# Patient Record
Sex: Female | Born: 1945
Health system: Southern US, Community
[De-identification: ages and names within clinical notes are randomized; demographics above are authoritative.]

## PROBLEM LIST (undated history)

## (undated) DIAGNOSIS — J302 Other seasonal allergic rhinitis: Secondary | ICD-10-CM

## (undated) DIAGNOSIS — E7800 Pure hypercholesterolemia, unspecified: Secondary | ICD-10-CM

## (undated) DIAGNOSIS — I10 Essential (primary) hypertension: Secondary | ICD-10-CM

## (undated) DIAGNOSIS — M1990 Unspecified osteoarthritis, unspecified site: Secondary | ICD-10-CM

## (undated) HISTORY — PX: COLONOSCOPY: SHX174

## (undated) HISTORY — PX: EYE SURGERY: SHX253

---

## 2001-03-07 ENCOUNTER — Encounter: Payer: Self-pay | Admitting: Internal Medicine

## 2001-03-07 ENCOUNTER — Ambulatory Visit (HOSPITAL_COMMUNITY): Admission: RE | Admit: 2001-03-07 | Discharge: 2001-03-07 | Payer: Self-pay | Admitting: Internal Medicine

## 2002-03-09 ENCOUNTER — Ambulatory Visit (HOSPITAL_COMMUNITY): Admission: RE | Admit: 2002-03-09 | Discharge: 2002-03-09 | Payer: Self-pay | Admitting: Internal Medicine

## 2002-03-09 ENCOUNTER — Encounter: Payer: Self-pay | Admitting: Internal Medicine

## 2002-04-10 ENCOUNTER — Ambulatory Visit (HOSPITAL_COMMUNITY): Admission: RE | Admit: 2002-04-10 | Discharge: 2002-04-10 | Payer: Self-pay | Admitting: Internal Medicine

## 2003-03-17 ENCOUNTER — Ambulatory Visit (HOSPITAL_COMMUNITY): Admission: RE | Admit: 2003-03-17 | Discharge: 2003-03-17 | Payer: Self-pay | Admitting: Internal Medicine

## 2004-04-27 ENCOUNTER — Ambulatory Visit (HOSPITAL_COMMUNITY): Admission: RE | Admit: 2004-04-27 | Discharge: 2004-04-27 | Payer: Self-pay | Admitting: Internal Medicine

## 2005-04-30 ENCOUNTER — Ambulatory Visit (HOSPITAL_COMMUNITY): Admission: RE | Admit: 2005-04-30 | Discharge: 2005-04-30 | Payer: Self-pay | Admitting: Internal Medicine

## 2006-05-09 ENCOUNTER — Ambulatory Visit (HOSPITAL_COMMUNITY): Admission: RE | Admit: 2006-05-09 | Discharge: 2006-05-09 | Payer: Self-pay | Admitting: Internal Medicine

## 2007-05-12 ENCOUNTER — Ambulatory Visit (HOSPITAL_COMMUNITY): Admission: RE | Admit: 2007-05-12 | Discharge: 2007-05-12 | Payer: Self-pay | Admitting: Internal Medicine

## 2008-06-02 ENCOUNTER — Ambulatory Visit (HOSPITAL_COMMUNITY): Admission: RE | Admit: 2008-06-02 | Discharge: 2008-06-02 | Payer: Self-pay | Admitting: Internal Medicine

## 2009-06-06 ENCOUNTER — Ambulatory Visit (HOSPITAL_COMMUNITY)
Admission: RE | Admit: 2009-06-06 | Discharge: 2009-06-06 | Payer: Self-pay | Source: Home / Self Care | Admitting: Internal Medicine

## 2010-06-19 ENCOUNTER — Ambulatory Visit (HOSPITAL_COMMUNITY)
Admission: RE | Admit: 2010-06-19 | Discharge: 2010-06-19 | Payer: Self-pay | Source: Home / Self Care | Attending: Internal Medicine | Admitting: Internal Medicine

## 2011-06-08 ENCOUNTER — Other Ambulatory Visit (HOSPITAL_COMMUNITY): Payer: Self-pay | Admitting: Internal Medicine

## 2011-06-08 DIAGNOSIS — Z139 Encounter for screening, unspecified: Secondary | ICD-10-CM

## 2011-06-22 ENCOUNTER — Ambulatory Visit (HOSPITAL_COMMUNITY)
Admission: RE | Admit: 2011-06-22 | Discharge: 2011-06-22 | Disposition: A | Discharge status: Home / Self Care | Payer: Medicare Other | Source: Ambulatory Visit | Attending: Internal Medicine | Admitting: Internal Medicine

## 2011-06-22 DIAGNOSIS — Z1231 Encounter for screening mammogram for malignant neoplasm of breast: Secondary | ICD-10-CM | POA: Insufficient documentation

## 2011-06-22 DIAGNOSIS — Z139 Encounter for screening, unspecified: Secondary | ICD-10-CM

## 2012-06-10 ENCOUNTER — Other Ambulatory Visit (HOSPITAL_COMMUNITY): Payer: Self-pay | Admitting: Internal Medicine

## 2012-06-10 DIAGNOSIS — Z139 Encounter for screening, unspecified: Secondary | ICD-10-CM

## 2012-06-24 ENCOUNTER — Ambulatory Visit (HOSPITAL_COMMUNITY)
Admission: RE | Admit: 2012-06-24 | Discharge: 2012-06-24 | Disposition: A | Discharge status: Home / Self Care | Payer: Medicare Other | Source: Ambulatory Visit | Attending: Internal Medicine | Admitting: Internal Medicine

## 2012-06-24 DIAGNOSIS — Z139 Encounter for screening, unspecified: Secondary | ICD-10-CM

## 2012-06-24 DIAGNOSIS — Z1231 Encounter for screening mammogram for malignant neoplasm of breast: Secondary | ICD-10-CM | POA: Insufficient documentation

## 2012-08-08 ENCOUNTER — Other Ambulatory Visit (INDEPENDENT_AMBULATORY_CARE_PROVIDER_SITE_OTHER): Payer: Self-pay | Admitting: *Deleted

## 2012-08-08 ENCOUNTER — Telehealth (INDEPENDENT_AMBULATORY_CARE_PROVIDER_SITE_OTHER): Payer: Self-pay | Admitting: *Deleted

## 2012-08-08 DIAGNOSIS — Z1211 Encounter for screening for malignant neoplasm of colon: Secondary | ICD-10-CM

## 2012-08-08 MED ORDER — PEG-KCL-NACL-NASULF-NA ASC-C 100 G PO SOLR: 1.0000 | Freq: Once | ORAL | Status: DC

## 2012-08-08 NOTE — Telephone Encounter (Signed)
Patient needs movi prep 

## 2012-08-27 ENCOUNTER — Telehealth (INDEPENDENT_AMBULATORY_CARE_PROVIDER_SITE_OTHER): Payer: Self-pay | Admitting: *Deleted

## 2012-08-27 NOTE — Telephone Encounter (Signed)
  Procedure: tcs  Reason/Indication:  screening  Has patient had this procedure before?  Yes, 2003  If so, when, by whom and where?    Is there a family history of colon cancer?  no  Who?  What age when diagnosed?    Is patient diabetic?   no      Does patient have prosthetic heart valve?  no  Do you have a pacemaker?  no  Has patient ever had endocarditis? no  Has patient had joint replacement within last 12 months?  no  Is patient on Coumadin, Plavix and/or Aspirin? no  Medications: vitamins  Allergies: nkda  Medication Adjustment:   Procedure date & time: 09/25/12 at 830

## 2012-08-28 NOTE — Telephone Encounter (Signed)
agree

## 2012-09-11 ENCOUNTER — Encounter (HOSPITAL_COMMUNITY): Payer: Self-pay | Admitting: Pharmacy Technician

## 2012-09-25 ENCOUNTER — Ambulatory Visit (HOSPITAL_COMMUNITY)
Admission: RE | Admit: 2012-09-25 | Discharge: 2012-09-25 | Disposition: A | Discharge status: Home / Self Care | Payer: Medicare Other | Source: Ambulatory Visit | Attending: Internal Medicine | Admitting: Internal Medicine

## 2012-09-25 ENCOUNTER — Encounter (HOSPITAL_COMMUNITY)
Admission: RE | Disposition: A | Discharge status: Home / Self Care | Payer: Self-pay | Source: Ambulatory Visit | Attending: Internal Medicine

## 2012-09-25 ENCOUNTER — Encounter (HOSPITAL_COMMUNITY): Payer: Self-pay | Admitting: *Deleted

## 2012-09-25 DIAGNOSIS — K644 Residual hemorrhoidal skin tags: Secondary | ICD-10-CM | POA: Insufficient documentation

## 2012-09-25 DIAGNOSIS — D126 Benign neoplasm of colon, unspecified: Secondary | ICD-10-CM

## 2012-09-25 DIAGNOSIS — Z1211 Encounter for screening for malignant neoplasm of colon: Secondary | ICD-10-CM | POA: Insufficient documentation

## 2012-09-25 HISTORY — PX: COLONOSCOPY: SHX5424

## 2012-09-25 HISTORY — DX: Pure hypercholesterolemia, unspecified: E78.00

## 2012-09-25 SURGERY — COLONOSCOPY
Anesthesia: Moderate Sedation

## 2012-09-25 MED ORDER — MIDAZOLAM HCL 5 MG/5ML IJ SOLN
INTRAMUSCULAR | Status: AC
  Filled 2012-09-25: qty 10

## 2012-09-25 MED ORDER — STERILE WATER FOR IRRIGATION IR SOLN
Status: DC | PRN
  Administered 2012-09-25: 08:00:00

## 2012-09-25 MED ORDER — SODIUM CHLORIDE 0.9 % IV SOLN
INTRAVENOUS | Status: DC
  Administered 2012-09-25: 08:00:00 via INTRAVENOUS

## 2012-09-25 MED ORDER — MEPERIDINE HCL 50 MG/ML IJ SOLN
INTRAMUSCULAR | Status: DC | PRN
  Administered 2012-09-25 (×2): 25 mg via INTRAVENOUS

## 2012-09-25 MED ORDER — MEPERIDINE HCL 50 MG/ML IJ SOLN
INTRAMUSCULAR | Status: AC
  Filled 2012-09-25: qty 1

## 2012-09-25 MED ORDER — MIDAZOLAM HCL 5 MG/5ML IJ SOLN
INTRAMUSCULAR | Status: DC | PRN
  Administered 2012-09-25: 1 mg via INTRAVENOUS
  Administered 2012-09-25 (×2): 2 mg via INTRAVENOUS

## 2012-09-25 NOTE — H&P (Signed)
Jessica Dominguez is an 67 y.o. female.   Chief Complaint: Patient is here for colonoscopy. HPI: Patient is 67 year old Caucasian female who is in for screening colonoscopy. Her last exam was 10 years ago. She denies abdominal pain change in bowel habits rectal bleeding. Family history is negative for colorectal carcinoma.  Past Medical History  Diagnosis Date  . Hypercholesteremia     Past Surgical History  Procedure Laterality Date  . Eye surgery    . Colonoscopy      Family History  Problem Relation Age of Onset  . Colon cancer Neg Hx    Social History:  reports that she has never smoked. She does not have any smokeless tobacco history on file. She reports that  drinks alcohol. She reports that she does not use illicit drugs.  Allergies: No Known Allergies  Medications Prior to Admission  Medication Sig Dispense Refill  . peg 3350 powder (MOVIPREP) 100 G SOLR Take 1 kit (100 g total) by mouth once.  1 kit  0  . Prenatal Vit-Fe Fumarate-FA (PRENATAL MULTIVITAMIN) TABS Take 1 tablet by mouth daily at 12 noon.        No results found for this or any previous visit (from the past 48 hour(s)). No results found.  ROS  Blood pressure 112/66, pulse 62, temperature 98 F (36.7 C), temperature source Oral, resp. rate 21, height 5\' 3"  (1.6 m), weight 143 lb (64.864 kg), SpO2 97.00%. Physical Exam  Constitutional: She appears well-developed and well-nourished.  HENT:  Mouth/Throat: Oropharynx is clear and moist.  Eyes: Conjunctivae are normal. No scleral icterus.  Neck: No thyromegaly present.  Cardiovascular: Normal rate, regular rhythm and normal heart sounds.   No murmur heard. Respiratory: Effort normal and breath sounds normal.  GI: Soft. She exhibits no distension and no mass. There is no tenderness.  Musculoskeletal: She exhibits no edema.  Lymphadenopathy:    She has no cervical adenopathy.  Neurological: She is alert.  Skin: Skin is warm and dry.      Assessment/Plan Average risk screening colonoscopy.  REHMAN,NAJEEB U 09/25/2012, 8:26 AM

## 2012-09-25 NOTE — Op Note (Signed)
COLONOSCOPY PROCEDURE REPORT  PATIENT:  Jessica Dominguez  MR#:  [id number] Birthdate:  09/26/1945, 67 y.o., female Endoscopist:  Dr. Malissa Hippo, MD Referred By:  Dr. Carylon Perches, MD  Procedure Date: 09/25/2012  Procedure:   Colonoscopy  Indications:  Patient is 67 year old Caucasian female who is undergoing average risk screening colonoscopy.  Informed Consent:  The procedure and risks were reviewed with the patient and informed consent was obtained.  Medications:  Demerol 50 mg IV Versed 5 mg IV  Description of procedure:  After a digital rectal exam was performed, that colonoscope was advanced from the anus through the rectum and colon to the area of the cecum, ileocecal valve and appendiceal orifice. The cecum was deeply intubated. These structures were well-seen and photographed for the record. From the level of the cecum and ileocecal valve, the scope was slowly and cautiously withdrawn. The mucosal surfaces were carefully surveyed utilizing scope tip to flexion to facilitate fold flattening as needed. The scope was pulled down into the rectum where a thorough exam including retroflexion was performed.  Findings:  Prep excellent. Small polyp a miracle biopsy from the hepatic flexure. Normal rectal mucosa. Small hemorrhoids below the dentate line. Single small anal tag.    Therapeutic/Diagnostic Maneuvers Performed:  See above  Complications:  None  Cecal Withdrawal Time:  13 minutes  Impression:  Examination performed to cecum. Small polyp ablated via cold biopsy from hepatic flexure. External hemorrhoids and single anal tag.  Recommendations:  Standard instructions given. I will contact patient with biopsy results and further recommendations.  REHMAN,NAJEEB U  09/25/2012 9:08 AM  CC: Dr. Carylon Perches, MD & Dr. Bonnetta Barry ref. provider found

## 2012-09-29 ENCOUNTER — Encounter (HOSPITAL_COMMUNITY): Payer: Self-pay | Admitting: Internal Medicine

## 2012-10-06 ENCOUNTER — Encounter (INDEPENDENT_AMBULATORY_CARE_PROVIDER_SITE_OTHER): Payer: Self-pay | Admitting: *Deleted

## 2013-06-11 ENCOUNTER — Other Ambulatory Visit (HOSPITAL_COMMUNITY): Payer: Self-pay | Admitting: Internal Medicine

## 2013-06-11 DIAGNOSIS — Z139 Encounter for screening, unspecified: Secondary | ICD-10-CM

## 2013-06-30 ENCOUNTER — Ambulatory Visit (HOSPITAL_COMMUNITY)
Admission: RE | Admit: 2013-06-30 | Discharge: 2013-06-30 | Disposition: A | Discharge status: Home / Self Care | Payer: Medicare Other | Source: Ambulatory Visit | Attending: Internal Medicine | Admitting: Internal Medicine

## 2013-06-30 DIAGNOSIS — Z139 Encounter for screening, unspecified: Secondary | ICD-10-CM

## 2013-06-30 DIAGNOSIS — Z1231 Encounter for screening mammogram for malignant neoplasm of breast: Secondary | ICD-10-CM | POA: Insufficient documentation

## 2014-06-17 ENCOUNTER — Other Ambulatory Visit (HOSPITAL_COMMUNITY): Payer: Self-pay | Admitting: Internal Medicine

## 2014-06-17 DIAGNOSIS — Z1231 Encounter for screening mammogram for malignant neoplasm of breast: Secondary | ICD-10-CM

## 2014-07-07 ENCOUNTER — Ambulatory Visit (HOSPITAL_COMMUNITY)
Admission: RE | Admit: 2014-07-07 | Discharge: 2014-07-07 | Disposition: A | Discharge status: Home / Self Care | Payer: Medicare Other | Source: Ambulatory Visit | Attending: Internal Medicine | Admitting: Internal Medicine

## 2014-07-07 DIAGNOSIS — Z1231 Encounter for screening mammogram for malignant neoplasm of breast: Secondary | ICD-10-CM

## 2015-07-01 ENCOUNTER — Other Ambulatory Visit (HOSPITAL_COMMUNITY): Payer: Self-pay | Admitting: Internal Medicine

## 2015-07-01 DIAGNOSIS — Z1231 Encounter for screening mammogram for malignant neoplasm of breast: Secondary | ICD-10-CM

## 2015-07-14 ENCOUNTER — Ambulatory Visit (HOSPITAL_COMMUNITY)
Admission: RE | Admit: 2015-07-14 | Discharge: 2015-07-14 | Disposition: A | Discharge status: Home / Self Care | Payer: Medicare HMO | Source: Ambulatory Visit | Attending: Internal Medicine | Admitting: Internal Medicine

## 2015-07-14 DIAGNOSIS — Z1231 Encounter for screening mammogram for malignant neoplasm of breast: Secondary | ICD-10-CM | POA: Insufficient documentation

## 2015-12-07 DIAGNOSIS — E781 Pure hyperglyceridemia: Secondary | ICD-10-CM | POA: Diagnosis not present

## 2015-12-07 DIAGNOSIS — Z79899 Other long term (current) drug therapy: Secondary | ICD-10-CM | POA: Diagnosis not present

## 2015-12-07 DIAGNOSIS — M1990 Unspecified osteoarthritis, unspecified site: Secondary | ICD-10-CM | POA: Diagnosis not present

## 2015-12-07 DIAGNOSIS — E785 Hyperlipidemia, unspecified: Secondary | ICD-10-CM | POA: Diagnosis not present

## 2015-12-15 DIAGNOSIS — M1990 Unspecified osteoarthritis, unspecified site: Secondary | ICD-10-CM | POA: Diagnosis not present

## 2015-12-15 DIAGNOSIS — Z6826 Body mass index (BMI) 26.0-26.9, adult: Secondary | ICD-10-CM | POA: Diagnosis not present

## 2015-12-15 DIAGNOSIS — E785 Hyperlipidemia, unspecified: Secondary | ICD-10-CM | POA: Diagnosis not present

## 2015-12-15 DIAGNOSIS — Z0001 Encounter for general adult medical examination with abnormal findings: Secondary | ICD-10-CM | POA: Diagnosis not present

## 2016-02-28 DIAGNOSIS — Z23 Encounter for immunization: Secondary | ICD-10-CM | POA: Diagnosis not present

## 2016-06-28 ENCOUNTER — Other Ambulatory Visit (HOSPITAL_COMMUNITY): Payer: Self-pay | Admitting: Internal Medicine

## 2016-06-28 DIAGNOSIS — Z1231 Encounter for screening mammogram for malignant neoplasm of breast: Secondary | ICD-10-CM

## 2016-07-19 ENCOUNTER — Ambulatory Visit (HOSPITAL_COMMUNITY)
Admission: RE | Admit: 2016-07-19 | Discharge: 2016-07-19 | Disposition: A | Discharge status: Home / Self Care | Payer: Medicare HMO | Source: Ambulatory Visit | Attending: Internal Medicine | Admitting: Internal Medicine

## 2016-07-19 ENCOUNTER — Other Ambulatory Visit (HOSPITAL_COMMUNITY): Payer: Self-pay | Admitting: Internal Medicine

## 2016-07-19 DIAGNOSIS — Z1231 Encounter for screening mammogram for malignant neoplasm of breast: Secondary | ICD-10-CM

## 2016-08-17 DIAGNOSIS — Z6825 Body mass index (BMI) 25.0-25.9, adult: Secondary | ICD-10-CM | POA: Diagnosis not present

## 2016-08-17 DIAGNOSIS — Z0000 Encounter for general adult medical examination without abnormal findings: Secondary | ICD-10-CM | POA: Diagnosis not present

## 2016-08-17 DIAGNOSIS — R238 Other skin changes: Secondary | ICD-10-CM | POA: Diagnosis not present

## 2016-12-17 DIAGNOSIS — Z79899 Other long term (current) drug therapy: Secondary | ICD-10-CM | POA: Diagnosis not present

## 2016-12-17 DIAGNOSIS — E785 Hyperlipidemia, unspecified: Secondary | ICD-10-CM | POA: Diagnosis not present

## 2016-12-17 DIAGNOSIS — M1990 Unspecified osteoarthritis, unspecified site: Secondary | ICD-10-CM | POA: Diagnosis not present

## 2016-12-25 DIAGNOSIS — Z6826 Body mass index (BMI) 26.0-26.9, adult: Secondary | ICD-10-CM | POA: Diagnosis not present

## 2016-12-25 DIAGNOSIS — M1990 Unspecified osteoarthritis, unspecified site: Secondary | ICD-10-CM | POA: Diagnosis not present

## 2016-12-25 DIAGNOSIS — E785 Hyperlipidemia, unspecified: Secondary | ICD-10-CM | POA: Diagnosis not present

## 2016-12-25 DIAGNOSIS — Z0001 Encounter for general adult medical examination with abnormal findings: Secondary | ICD-10-CM | POA: Diagnosis not present

## 2017-02-26 DIAGNOSIS — Z23 Encounter for immunization: Secondary | ICD-10-CM | POA: Diagnosis not present

## 2017-07-04 ENCOUNTER — Other Ambulatory Visit (HOSPITAL_COMMUNITY): Payer: Self-pay | Admitting: Internal Medicine

## 2017-07-04 DIAGNOSIS — Z1231 Encounter for screening mammogram for malignant neoplasm of breast: Secondary | ICD-10-CM

## 2017-07-25 ENCOUNTER — Ambulatory Visit (HOSPITAL_COMMUNITY)
Admission: RE | Admit: 2017-07-25 | Discharge: 2017-07-25 | Disposition: A | Discharge status: Home / Self Care | Payer: Medicare HMO | Source: Ambulatory Visit | Attending: Internal Medicine | Admitting: Internal Medicine

## 2017-07-25 DIAGNOSIS — Z1231 Encounter for screening mammogram for malignant neoplasm of breast: Secondary | ICD-10-CM | POA: Diagnosis not present

## 2018-01-16 DIAGNOSIS — Z79899 Other long term (current) drug therapy: Secondary | ICD-10-CM | POA: Diagnosis not present

## 2018-01-16 DIAGNOSIS — M1990 Unspecified osteoarthritis, unspecified site: Secondary | ICD-10-CM | POA: Diagnosis not present

## 2018-01-16 DIAGNOSIS — E785 Hyperlipidemia, unspecified: Secondary | ICD-10-CM | POA: Diagnosis not present

## 2018-01-23 DIAGNOSIS — N189 Chronic kidney disease, unspecified: Secondary | ICD-10-CM | POA: Diagnosis not present

## 2018-01-23 DIAGNOSIS — E785 Hyperlipidemia, unspecified: Secondary | ICD-10-CM | POA: Diagnosis not present

## 2018-01-23 DIAGNOSIS — Z6827 Body mass index (BMI) 27.0-27.9, adult: Secondary | ICD-10-CM | POA: Diagnosis not present

## 2018-01-23 DIAGNOSIS — Z0001 Encounter for general adult medical examination with abnormal findings: Secondary | ICD-10-CM | POA: Diagnosis not present

## 2018-01-23 DIAGNOSIS — M1990 Unspecified osteoarthritis, unspecified site: Secondary | ICD-10-CM | POA: Diagnosis not present

## 2018-02-06 DIAGNOSIS — N189 Chronic kidney disease, unspecified: Secondary | ICD-10-CM | POA: Diagnosis not present

## 2018-02-06 DIAGNOSIS — Z79899 Other long term (current) drug therapy: Secondary | ICD-10-CM | POA: Diagnosis not present

## 2018-02-06 DIAGNOSIS — E785 Hyperlipidemia, unspecified: Secondary | ICD-10-CM | POA: Diagnosis not present

## 2018-02-13 DIAGNOSIS — Z1283 Encounter for screening for malignant neoplasm of skin: Secondary | ICD-10-CM | POA: Diagnosis not present

## 2018-02-13 DIAGNOSIS — L918 Other hypertrophic disorders of the skin: Secondary | ICD-10-CM | POA: Diagnosis not present

## 2018-02-13 DIAGNOSIS — D225 Melanocytic nevi of trunk: Secondary | ICD-10-CM | POA: Diagnosis not present

## 2018-02-13 DIAGNOSIS — L820 Inflamed seborrheic keratosis: Secondary | ICD-10-CM | POA: Diagnosis not present

## 2018-02-27 DIAGNOSIS — Z23 Encounter for immunization: Secondary | ICD-10-CM | POA: Diagnosis not present

## 2018-07-01 ENCOUNTER — Other Ambulatory Visit (HOSPITAL_COMMUNITY): Payer: Self-pay | Admitting: Internal Medicine

## 2018-07-01 DIAGNOSIS — Z1231 Encounter for screening mammogram for malignant neoplasm of breast: Secondary | ICD-10-CM

## 2018-07-30 ENCOUNTER — Ambulatory Visit (HOSPITAL_COMMUNITY): Payer: Medicare HMO

## 2018-08-06 ENCOUNTER — Other Ambulatory Visit: Payer: Self-pay

## 2018-08-06 ENCOUNTER — Ambulatory Visit (HOSPITAL_COMMUNITY)
Admission: RE | Admit: 2018-08-06 | Discharge: 2018-08-06 | Disposition: A | Discharge status: Home / Self Care | Payer: Medicare HMO | Source: Ambulatory Visit | Attending: Internal Medicine | Admitting: Internal Medicine

## 2018-08-06 DIAGNOSIS — Z1231 Encounter for screening mammogram for malignant neoplasm of breast: Secondary | ICD-10-CM | POA: Diagnosis not present

## 2019-02-03 DIAGNOSIS — Z79899 Other long term (current) drug therapy: Secondary | ICD-10-CM | POA: Diagnosis not present

## 2019-02-03 DIAGNOSIS — N181 Chronic kidney disease, stage 1: Secondary | ICD-10-CM | POA: Diagnosis not present

## 2019-02-03 DIAGNOSIS — E785 Hyperlipidemia, unspecified: Secondary | ICD-10-CM | POA: Diagnosis not present

## 2019-02-03 DIAGNOSIS — M1990 Unspecified osteoarthritis, unspecified site: Secondary | ICD-10-CM | POA: Diagnosis not present

## 2019-02-05 DIAGNOSIS — Z23 Encounter for immunization: Secondary | ICD-10-CM | POA: Diagnosis not present

## 2019-02-10 DIAGNOSIS — M1990 Unspecified osteoarthritis, unspecified site: Secondary | ICD-10-CM | POA: Diagnosis not present

## 2019-02-10 DIAGNOSIS — R3121 Asymptomatic microscopic hematuria: Secondary | ICD-10-CM | POA: Diagnosis not present

## 2019-02-10 DIAGNOSIS — E785 Hyperlipidemia, unspecified: Secondary | ICD-10-CM | POA: Diagnosis not present

## 2019-02-10 DIAGNOSIS — Z0001 Encounter for general adult medical examination with abnormal findings: Secondary | ICD-10-CM | POA: Diagnosis not present

## 2019-06-10 ENCOUNTER — Ambulatory Visit: Payer: Medicare Other | Attending: Internal Medicine

## 2019-06-10 DIAGNOSIS — Z23 Encounter for immunization: Secondary | ICD-10-CM | POA: Insufficient documentation

## 2019-06-10 NOTE — Progress Notes (Signed)
   Covid-19 Vaccination Clinic  Name:  Jessica Dominguez    MRN: [id number] DOB: 02-Feb-1946  06/10/2019  [name] was observed post Covid-19 immunization for 15 minutes without incidence. She was provided with Vaccine Information Sheet and instruction to access the V-Safe system.   [name] was instructed to call 911 with any severe reactions post vaccine: Marland Kitchen Difficulty breathing  . Swelling of your face and throat  . A fast heartbeat  . A bad rash all over your body  . Dizziness and weakness    Immunizations Administered    Name Date Dose VIS Date Route   Pfizer COVID-19 Vaccine 06/10/2019 10:36 AM 0.3 mL 05/01/2019 Intramuscular   Manufacturer: [place]   Lot: [id number]   [place]: [id number]

## 2019-06-29 ENCOUNTER — Ambulatory Visit: Payer: Medicare HMO | Attending: Internal Medicine

## 2019-06-29 DIAGNOSIS — Z23 Encounter for immunization: Secondary | ICD-10-CM

## 2019-06-29 NOTE — Progress Notes (Signed)
   Covid-19 Vaccination Clinic  Name:  Jessica Dominguez    MRN: [id number] DOB: Jul 01, 1945  06/29/2019  Jessica Dominguez was observed post Covid-19 immunization for 15 minutes without incidence. She was provided with Vaccine Information Sheet and instruction to access the V-Safe system.   Jessica Dominguez was instructed to call 911 with any severe reactions post vaccine: Marland Kitchen Difficulty breathing  . Swelling of your face and throat  . A fast heartbeat  . A bad rash all over your body  . Dizziness and weakness    Immunizations Administered    Name Date Dose VIS Date Route   Pfizer COVID-19 Vaccine 06/29/2019  3:41 PM 0.3 mL 05/01/2019 Intramuscular   Manufacturer: [place]   Lot: [id number]   [place]: [id number]

## 2019-07-16 ENCOUNTER — Other Ambulatory Visit (HOSPITAL_COMMUNITY): Payer: Self-pay | Admitting: Internal Medicine

## 2019-07-16 DIAGNOSIS — Z1231 Encounter for screening mammogram for malignant neoplasm of breast: Secondary | ICD-10-CM

## 2019-08-10 ENCOUNTER — Other Ambulatory Visit: Payer: Self-pay

## 2019-08-10 ENCOUNTER — Ambulatory Visit (HOSPITAL_COMMUNITY)
Admission: RE | Admit: 2019-08-10 | Discharge: 2019-08-10 | Disposition: A | Discharge status: Home / Self Care | Payer: Medicare HMO | Source: Ambulatory Visit | Attending: Internal Medicine | Admitting: Internal Medicine

## 2019-08-10 DIAGNOSIS — Z1231 Encounter for screening mammogram for malignant neoplasm of breast: Secondary | ICD-10-CM

## 2020-02-25 ENCOUNTER — Ambulatory Visit: Payer: Medicare HMO | Attending: Internal Medicine

## 2020-02-25 ENCOUNTER — Ambulatory Visit: Payer: Medicare HMO

## 2020-02-25 DIAGNOSIS — Z23 Encounter for immunization: Secondary | ICD-10-CM

## 2020-02-25 NOTE — Progress Notes (Signed)
   Covid-19 Vaccination Clinic  Name:  ELSEY HOLTS    MRN: [id number] DOB: 1945/09/29  02/25/2020  Ms. Lozada was observed post Covid-19 immunization for 15 minutes without incident. She was provided with Vaccine Information Sheet and instruction to access the V-Safe system.   Ms. Berkemeier was instructed to call 911 with any severe reactions post vaccine: Marland Kitchen Difficulty breathing  . Swelling of face and throat  . A fast heartbeat  . A bad rash all over body  . Dizziness and weakness

## 2020-03-03 ENCOUNTER — Ambulatory Visit: Payer: Medicare HMO

## 2020-04-28 IMAGING — MG DIGITAL SCREENING BILAT W/ TOMO W/ CAD
6 of 10 series · 6 of 30 positions shown · non-contrast
Comparison: Previous exam(s).

CLINICAL DATA: Screening.

EXAM:
DIGITAL SCREENING BILATERAL MAMMOGRAM WITH TOMO AND CAD

[R MLO synth-2D (1 of 2)]
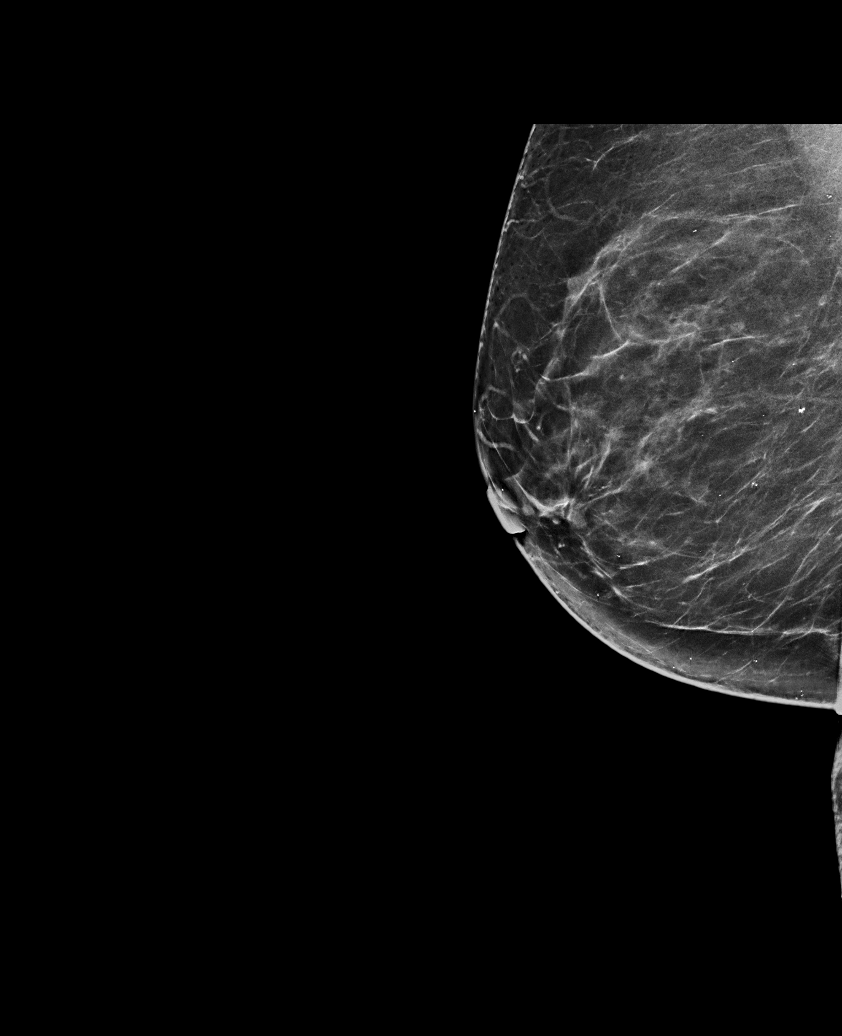

[R MLO synth-2D (2 of 2)]
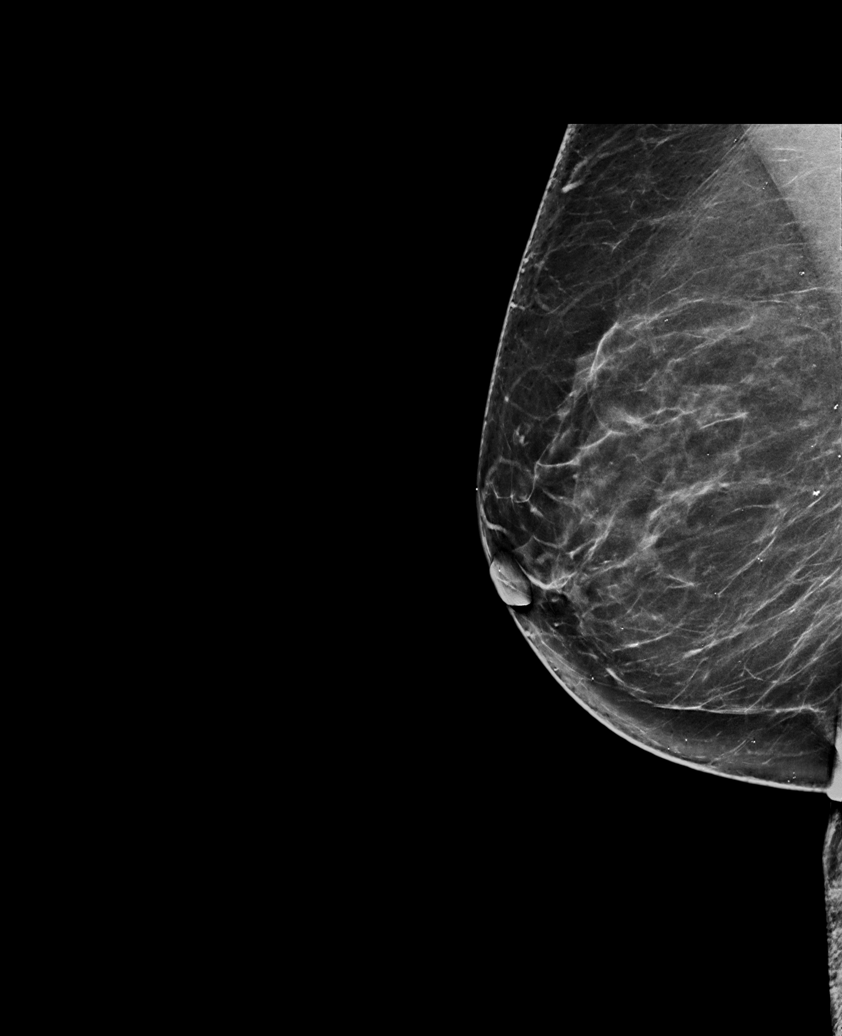

[R CC synth-2D]
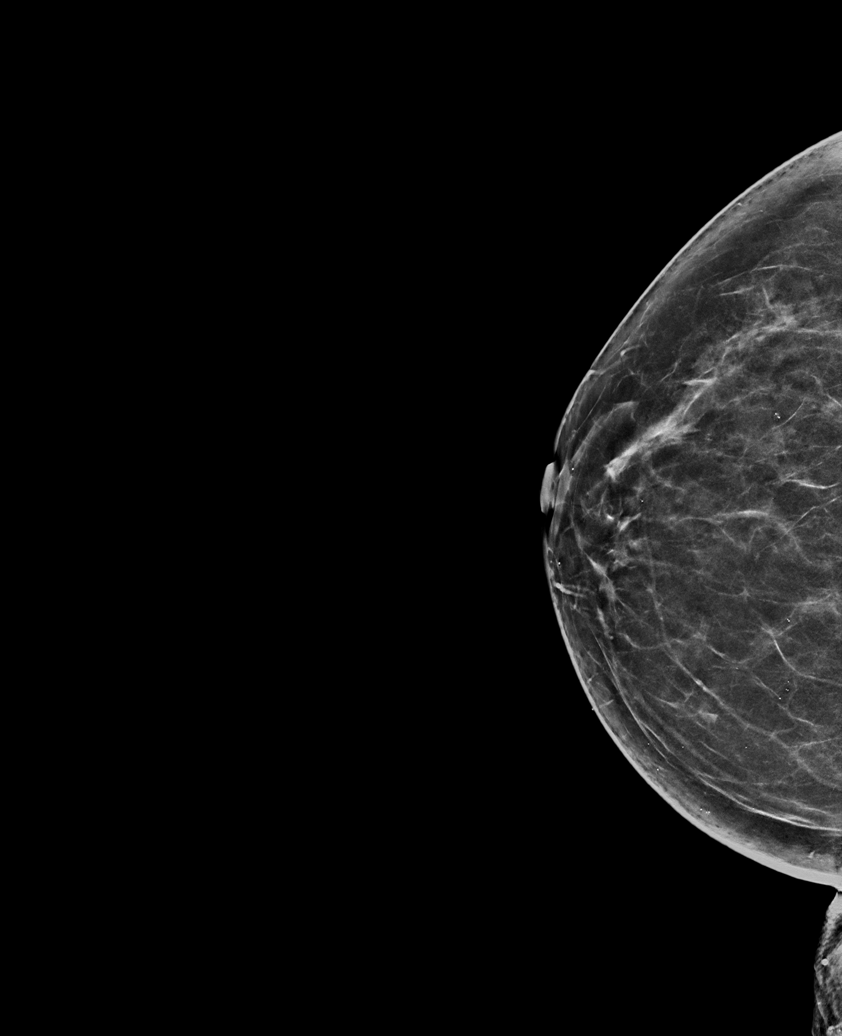

[L CC synth-2D]
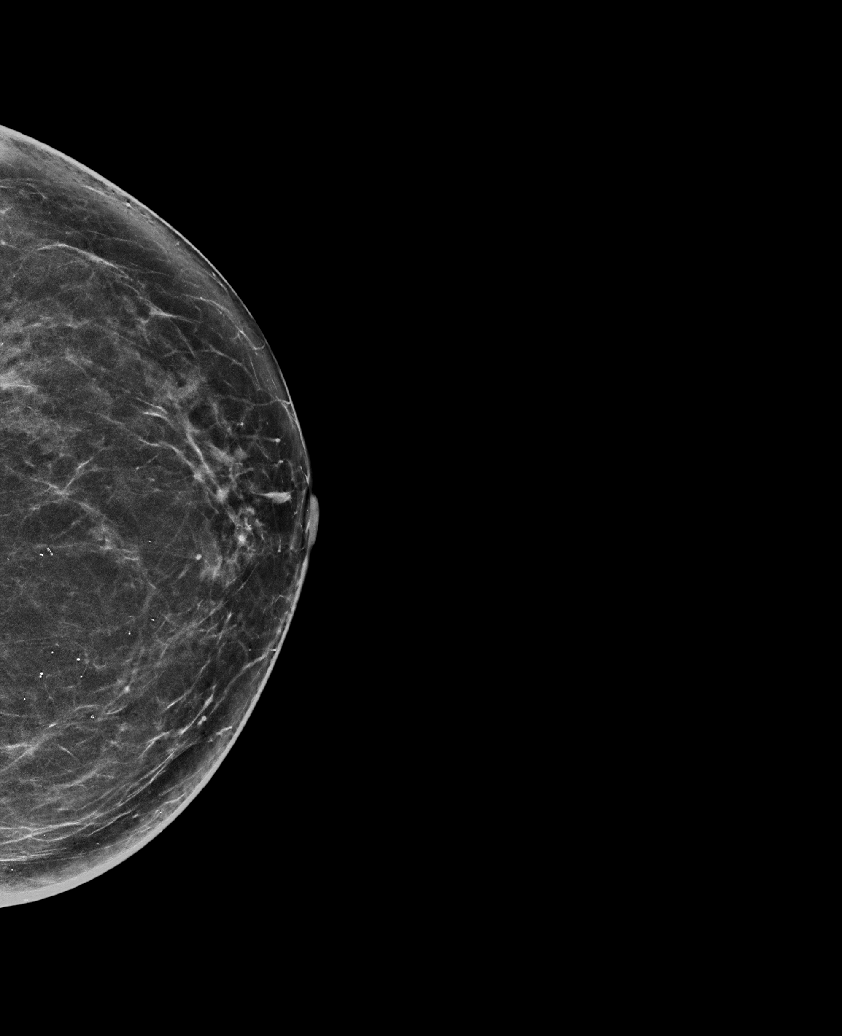

[L MLO synth-2D]
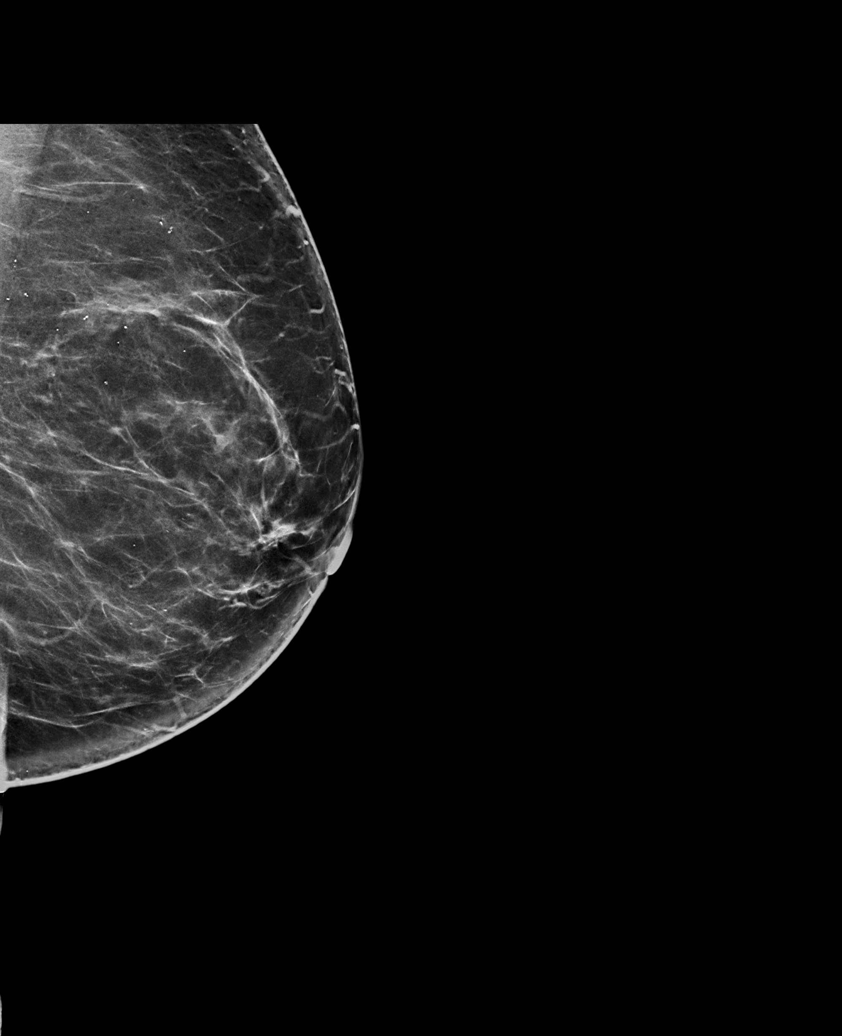

[R MLO tomo · tomo slice 40/79.0]
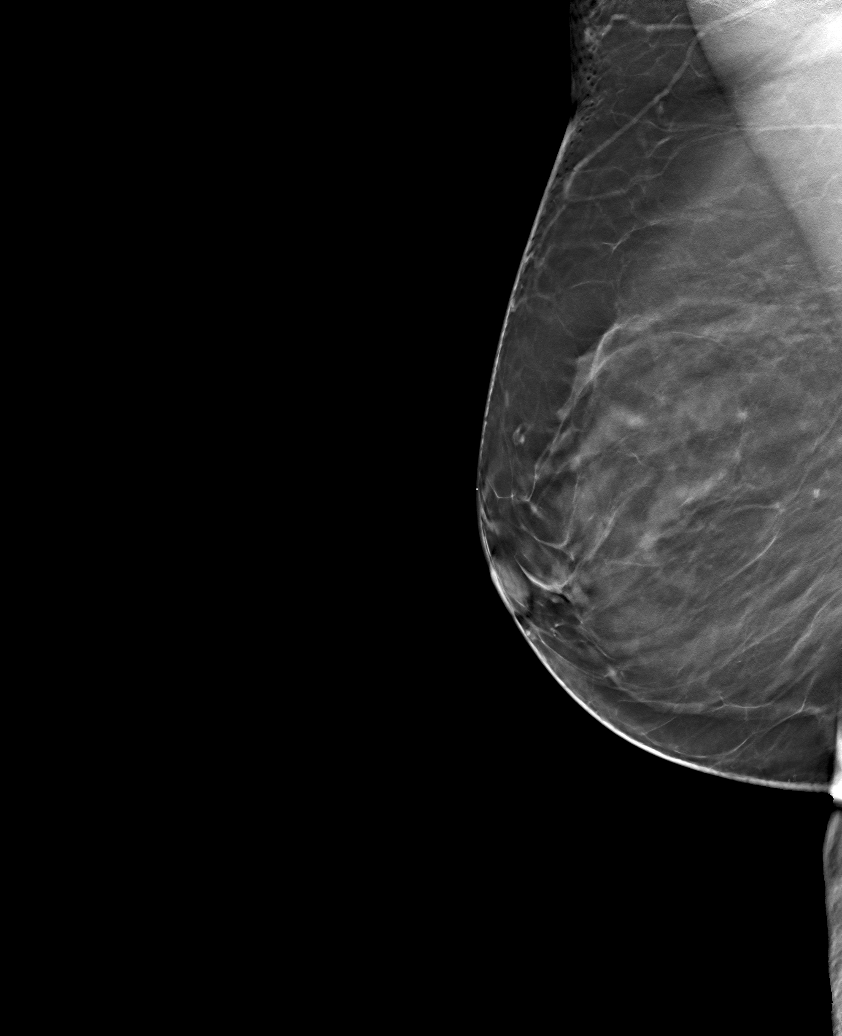

[6 of 30 positions shown; findings below may reference images not displayed]

ACR Breast Density Category b: There are scattered areas of
fibroglandular density.
FINDINGS: There are no findings suspicious for malignancy. Images were
processed with CAD.
IMPRESSION: No mammographic evidence of malignancy. A result letter of this
screening mammogram will be mailed directly to the patient.

RECOMMENDATION:
Screening mammogram in one year. (Code:CN-U-775)

BI-RADS CATEGORY  1: Negative.

## 2020-05-03 ENCOUNTER — Other Ambulatory Visit: Payer: Self-pay

## 2020-05-03 ENCOUNTER — Other Ambulatory Visit: Payer: Medicare HMO

## 2020-05-03 DIAGNOSIS — Z20822 Contact with and (suspected) exposure to covid-19: Secondary | ICD-10-CM

## 2020-05-04 LAB — SARS-COV-2, NAA 2 DAY TAT

## 2020-05-04 LAB — NOVEL CORONAVIRUS, NAA: SARS-CoV-2, NAA: NOT DETECTED

## 2020-07-20 ENCOUNTER — Other Ambulatory Visit (HOSPITAL_COMMUNITY): Payer: Self-pay | Admitting: Internal Medicine

## 2020-07-20 DIAGNOSIS — Z1231 Encounter for screening mammogram for malignant neoplasm of breast: Secondary | ICD-10-CM

## 2020-08-11 ENCOUNTER — Ambulatory Visit (HOSPITAL_COMMUNITY)
Admission: RE | Admit: 2020-08-11 | Discharge: 2020-08-11 | Disposition: A | Discharge status: Home / Self Care | Payer: Medicare HMO | Source: Ambulatory Visit | Attending: Internal Medicine | Admitting: Internal Medicine

## 2020-08-11 DIAGNOSIS — Z1231 Encounter for screening mammogram for malignant neoplasm of breast: Secondary | ICD-10-CM

## 2021-06-19 ENCOUNTER — Encounter (INDEPENDENT_AMBULATORY_CARE_PROVIDER_SITE_OTHER): Payer: Self-pay | Admitting: *Deleted

## 2021-07-04 ENCOUNTER — Other Ambulatory Visit (HOSPITAL_COMMUNITY): Payer: Self-pay | Admitting: Internal Medicine

## 2021-07-04 DIAGNOSIS — Z1231 Encounter for screening mammogram for malignant neoplasm of breast: Secondary | ICD-10-CM

## 2021-08-14 ENCOUNTER — Ambulatory Visit (HOSPITAL_COMMUNITY)
Admission: RE | Admit: 2021-08-14 | Discharge: 2021-08-14 | Disposition: A | Discharge status: Home / Self Care | Payer: Medicare HMO | Source: Ambulatory Visit | Attending: Internal Medicine | Admitting: Internal Medicine

## 2021-08-14 ENCOUNTER — Other Ambulatory Visit: Payer: Self-pay

## 2021-08-14 DIAGNOSIS — Z1231 Encounter for screening mammogram for malignant neoplasm of breast: Secondary | ICD-10-CM | POA: Diagnosis not present

## 2021-08-16 ENCOUNTER — Other Ambulatory Visit (HOSPITAL_COMMUNITY): Payer: Self-pay | Admitting: Internal Medicine

## 2021-08-16 DIAGNOSIS — R928 Other abnormal and inconclusive findings on diagnostic imaging of breast: Secondary | ICD-10-CM

## 2021-08-22 ENCOUNTER — Ambulatory Visit (INDEPENDENT_AMBULATORY_CARE_PROVIDER_SITE_OTHER): Payer: Medicare HMO | Admitting: Gastroenterology

## 2021-08-22 ENCOUNTER — Other Ambulatory Visit (INDEPENDENT_AMBULATORY_CARE_PROVIDER_SITE_OTHER): Payer: Self-pay

## 2021-08-22 ENCOUNTER — Telehealth (INDEPENDENT_AMBULATORY_CARE_PROVIDER_SITE_OTHER): Payer: Self-pay

## 2021-08-22 ENCOUNTER — Encounter (INDEPENDENT_AMBULATORY_CARE_PROVIDER_SITE_OTHER): Payer: Self-pay | Admitting: Gastroenterology

## 2021-08-22 ENCOUNTER — Encounter (INDEPENDENT_AMBULATORY_CARE_PROVIDER_SITE_OTHER): Payer: Self-pay

## 2021-08-22 VITALS — BP 132/77 | HR 73 | Temp 98.1°F | Ht 62.0 in | Wt 150.1 lb

## 2021-08-22 DIAGNOSIS — Z86010 Personal history of colonic polyps: Secondary | ICD-10-CM

## 2021-08-22 DIAGNOSIS — R197 Diarrhea, unspecified: Secondary | ICD-10-CM | POA: Diagnosis not present

## 2021-08-22 DIAGNOSIS — D126 Benign neoplasm of colon, unspecified: Secondary | ICD-10-CM

## 2021-08-22 DIAGNOSIS — Z1211 Encounter for screening for malignant neoplasm of colon: Secondary | ICD-10-CM | POA: Diagnosis not present

## 2021-08-22 MED ORDER — DICYCLOMINE HCL 10 MG PO CAPS: 10.0000 mg | ORAL_CAPSULE | Freq: Three times a day (TID) | ORAL | 1 refills | Status: AC | PRN

## 2021-08-22 MED ORDER — PEG 3350-KCL-NA BICARB-NACL 420 G PO SOLR: 4000.0000 mL | ORAL | 0 refills | Status: DC

## 2021-08-22 NOTE — Telephone Encounter (Signed)
Jery Hollern Ann Salvator Seppala, CMA  ?

## 2021-08-22 NOTE — H&P (View-Only) (Signed)
? ?Referring Provider: Asencion Noble, MD ?Primary Care Physician:  Asencion Noble, MD ?Primary GI Physician: new ? ?Chief Complaint  ?Patient presents with  ? Diarrhea  ?  Patient here today with complaints of diarrhea,especially after meals she will have cramps and then the diarrhea. Takes otc anti diarrheal prn.  ? ?HPI:   ?Jessica Dominguez is a 76 y.o. female with past medical history of high cholesterol. ? ?Patient presenting today for colonoscopy and diarrhea. ? ?She states that for the past few years she has had some lower abdominal discomfort and fecal urgency after eating. She reports 2-3 BMs per day. Stools are usually  looser, not watery. she occasionally has episodes where she does not make it to the restroom in time. She does note that caffeine seemed to make her symptoms worse. She has cut out caffienated coffee and tea with some improvement. She takes an otc diarrhea pill which will help some. She denies any rectal bleeding or melena. She denies any change in appetite or weight, no nausea or vomiting. Very infrequent issues with heartburn or acid reflux.  ? ?Recent labs done in January 2023 with PCP with normal electrolytes and blood counts.  ? ?NSAID use:no NSAIDs  ?Social hx: a couple glasses of wine a few times per week  ?Fam PY:PPJKDT had colon cancer but was metastasized from lung ? ?Last Colonoscopy: 2014, tubular adenoma ?Last Endoscopy:never ? ? ?Past Medical History:  ?Diagnosis Date  ? Hypercholesteremia   ? ? ?Past Surgical History:  ?Procedure Laterality Date  ? COLONOSCOPY    ? COLONOSCOPY N/A 09/25/2012  ? Procedure: COLONOSCOPY;  Surgeon: Rogene Houston, MD;  [facility];  Service: Endoscopy;  Laterality: N/A;  830  ? EYE SURGERY    ? ? ?No current outpatient medications on file.  ? ?No current facility-administered medications for this visit.  ? ? ?Allergies as of 08/22/2021  ? (No Known Allergies)  ? ? ?Family History  ?Problem Relation Age of Onset  ? Colon cancer Neg Hx    ? ? ?Social History  ? ?Socioeconomic History  ? Marital status: Married  ?  Spouse name: Not on file  ? Number of children: Not on file  ? Years of education: Not on file  ? Highest education level: Not on file  ?Occupational History  ? Not on file  ?Tobacco Use  ? Smoking status: Never  ? Smokeless tobacco: Not on file  ?Vaping Use  ? Vaping Use: Never used  ?Substance and Sexual Activity  ? Alcohol use: Yes  ?  Comment: occasionally  ? Drug use: No  ? Sexual activity: Not on file  ?Other Topics Concern  ? Not on file  ?Social History Narrative  ? Not on file  ? ?Social Determinants of Health  ? ?Financial Resource Strain: Not on file  ?Food Insecurity: Not on file  ?Transportation Needs: Not on file  ?Physical Activity: Not on file  ?Stress: Not on file  ?Social Connections: Not on file  ? ?Review of systems ?General: negative for malaise, night sweats, fever, chills, weight loss ?Neck: Negative for lumps, goiter, pain and significant neck swelling ?Resp: Negative for cough, wheezing, dyspnea at rest ?CV: Negative for chest pain, leg swelling, palpitations, orthopnea ?GI: denies melena, hematochezia, nausea, vomiting, constipation, dysphagia, odyonophagia, early satiety or unintentional weight loss. +diarrhea +lower abdominal pain ?MSK: Negative for joint pain or swelling, back pain, and muscle pain. ?Derm: Negative for itching or rash ?Psych: Denies depression, anxiety,  memory loss, confusion. No homicidal or suicidal ideation.  ?Heme: Negative for prolonged bleeding, bruising easily, and swollen nodes. ?Endocrine: Negative for cold or heat intolerance, polyuria, polydipsia and goiter. ?Neuro: negative for tremor, gait imbalance, syncope and seizures. ?The remainder of the review of systems is noncontributory. ? ?Physical Exam: ?BP 132/77 (BP Location: Left Arm, Patient Position: Sitting, Cuff Size: Large)   Pulse 73   Temp 98.1 ?F (36.7 ?C) (Oral)   Ht '5\' 2"'$  (1.575 m)   Wt 150 lb 1.6 oz (68.1 kg)   BMI  27.45 kg/m?  ?General:   Alert and oriented. No distress noted. Pleasant and cooperative.  ?Head:  Normocephalic and atraumatic. ?Eyes:  Conjuctiva clear without scleral icterus. ?Mouth:  Oral mucosa pink and moist. Good dentition. No lesions. ?Heart: Normal rate and rhythm, s1 and s2 heart sounds present.  ?Lungs: Clear lung sounds in all lobes. Respirations equal and unlabored. ?Abdomen:  +BS, soft, non-tender and non-distended. No rebound or guarding. No HSM or masses noted. ?Derm: No palmar erythema or jaundice ?Msk:  Symmetrical without gross deformities. Normal posture. ?Extremities:  Without edema. ?Neurologic:  Alert and  oriented x4 ?Psych:  Alert and cooperative. Normal mood and affect. ? ?Invalid input(s): 6 MONTHS  ? ?ASSESSMENT: ?Jessica Dominguez is a 76 y.o. female presenting today as a new patient for chronic diarrhea and for repeat surveillance colonoscopy.  ? ?Having looser stools/diarrhea with some lower abdominal cramping for the past few years, seems to be worse with certain foods/drinks. Gallbladder remains in situ. Recent electrolytes were WNL, no recent TSH or celiac testing done in the past. Will check these to rule out as underlying etiology, however, suspect symptoms are related to IBS-D. Recommend low FODMAP diet and food journal x2-3 weeks to correlate symptoms with certain triggers. Will send Bentyl '10mg'$  TID PRN for her to try, she may benefit from taking this prior to meals but can start with PRN dosing. No red flag symptoms. Patient denies melena, hematochezia, nausea, vomiting, constipation, dysphagia, odyonophagia, early satiety or weight loss.  ? ? ?Notably, last colonoscopy was in 2014 with tubular adenoma. She is over due to repeat surveillance colonoscopy, will get her scheduled for this. Indications, risks and benefits of procedure discussed in detail with patient. Patient verbalized understanding and is in agreement to proceed with Colonoscopy at this time.  ? ? ?PLAN:  ?Low  FODMAP diet ?2. Food journal x2-3 weeks ?3. Bentyl '10mg'$  TID PRN ?4. Schedule colonoscopy ?5. TSH and celiac panel ? ?All questions were answered, patient verbalized understanding and is in agreement with plan as outlined above.  ? ? ?Follow Up: ?6 months ? ?Javayah Magaw L. Alver Sorrow, MSN, APRN, AGNP-C ?Adult-Gerontology Nurse Practitioner ?[facility] ? ?

## 2021-08-22 NOTE — Progress Notes (Signed)
? ?Referring Provider: Asencion Noble, MD ?Primary Care Physician:  Asencion Noble, MD ?Primary GI Physician: new ? ?Chief Complaint  ?Patient presents with  ? Diarrhea  ?  Patient here today with complaints of diarrhea,especially after meals she will have cramps and then the diarrhea. Takes otc anti diarrheal prn.  ? ?HPI:   ?Jessica Dominguez is a 76 y.o. female with past medical history of high cholesterol. ? ?Patient presenting today for colonoscopy and diarrhea. ? ?She states that for the past few years she has had some lower abdominal discomfort and fecal urgency after eating. She reports 2-3 BMs per day. Stools are usually  looser, not watery. she occasionally has episodes where she does not make it to the restroom in time. She does note that caffeine seemed to make her symptoms worse. She has cut out caffienated coffee and tea with some improvement. She takes an otc diarrhea pill which will help some. She denies any rectal bleeding or melena. She denies any change in appetite or weight, no nausea or vomiting. Very infrequent issues with heartburn or acid reflux.  ? ?Recent labs done in January 2023 with PCP with normal electrolytes and blood counts.  ? ?NSAID use:no NSAIDs  ?Social hx: a couple glasses of wine a few times per week  ?Fam IO:NGEXBM had colon cancer but was metastasized from lung ? ?Last Colonoscopy: 2014, tubular adenoma ?Last Endoscopy:never ? ? ?Past Medical History:  ?Diagnosis Date  ? Hypercholesteremia   ? ? ?Past Surgical History:  ?Procedure Laterality Date  ? COLONOSCOPY    ? COLONOSCOPY N/A 09/25/2012  ? Procedure: COLONOSCOPY;  Surgeon: Rogene Houston, MD;  [facility]: [facility];  Service: Endoscopy;  Laterality: N/A;  830  ? EYE SURGERY    ? ? ?No current outpatient medications on file.  ? ?No current facility-administered medications for this visit.  ? ? ?Allergies as of 08/22/2021  ? (No Known Allergies)  ? ? ?Family History  ?Problem Relation Age of Onset  ? Colon cancer Neg Hx    ? ? ?Social History  ? ?Socioeconomic History  ? Marital status: Married  ?  Spouse name: Not on file  ? Number of children: Not on file  ? Years of education: Not on file  ? Highest education level: Not on file  ?Occupational History  ? Not on file  ?Tobacco Use  ? Smoking status: Never  ? Smokeless tobacco: Not on file  ?Vaping Use  ? Vaping Use: Never used  ?Substance and Sexual Activity  ? Alcohol use: Yes  ?  Comment: occasionally  ? Drug use: No  ? Sexual activity: Not on file  ?Other Topics Concern  ? Not on file  ?Social History Narrative  ? Not on file  ? ?Social Determinants of Health  ? ?Financial Resource Strain: Not on file  ?Food Insecurity: Not on file  ?Transportation Needs: Not on file  ?Physical Activity: Not on file  ?Stress: Not on file  ?Social Connections: Not on file  ? ?Review of systems ?General: negative for malaise, night sweats, fever, chills, weight loss ?Neck: Negative for lumps, goiter, pain and significant neck swelling ?Resp: Negative for cough, wheezing, dyspnea at rest ?CV: Negative for chest pain, leg swelling, palpitations, orthopnea ?GI: denies melena, hematochezia, nausea, vomiting, constipation, dysphagia, odyonophagia, early satiety or unintentional weight loss. +diarrhea +lower abdominal pain ?MSK: Negative for joint pain or swelling, back pain, and muscle pain. ?Derm: Negative for itching or rash ?Psych: Denies depression, anxiety,  memory loss, confusion. No homicidal or suicidal ideation.  ?Heme: Negative for prolonged bleeding, bruising easily, and swollen nodes. ?Endocrine: Negative for cold or heat intolerance, polyuria, polydipsia and goiter. ?Neuro: negative for tremor, gait imbalance, syncope and seizures. ?The remainder of the review of systems is noncontributory. ? ?Physical Exam: ?BP 132/77 (BP Location: Left Arm, Patient Position: Sitting, Cuff Size: Large)   Pulse 73   Temp 98.1 ?F (36.7 ?C) (Oral)   Ht '5\' 2"'$  (1.575 m)   Wt 150 lb 1.6 oz (68.1 kg)   BMI  27.45 kg/m?  ?General:   Alert and oriented. No distress noted. Pleasant and cooperative.  ?Head:  Normocephalic and atraumatic. ?Eyes:  Conjuctiva clear without scleral icterus. ?Mouth:  Oral mucosa pink and moist. Good dentition. No lesions. ?Heart: Normal rate and rhythm, s1 and s2 heart sounds present.  ?Lungs: Clear lung sounds in all lobes. Respirations equal and unlabored. ?Abdomen:  +BS, soft, non-tender and non-distended. No rebound or guarding. No HSM or masses noted. ?Derm: No palmar erythema or jaundice ?Msk:  Symmetrical without gross deformities. Normal posture. ?Extremities:  Without edema. ?Neurologic:  Alert and  oriented x4 ?Psych:  Alert and cooperative. Normal mood and affect. ? ?Invalid input(s): 6 MONTHS  ? ?ASSESSMENT: ?Jessica Dominguez is a 76 y.o. female presenting today as a new patient for chronic diarrhea and for repeat surveillance colonoscopy.  ? ?Having looser stools/diarrhea with some lower abdominal cramping for the past few years, seems to be worse with certain foods/drinks. Gallbladder remains in situ. Recent electrolytes were WNL, no recent TSH or celiac testing done in the past. Will check these to rule out as underlying etiology, however, suspect symptoms are related to IBS-D. Recommend low FODMAP diet and food journal x2-3 weeks to correlate symptoms with certain triggers. Will send Bentyl '10mg'$  TID PRN for her to try, she may benefit from taking this prior to meals but can start with PRN dosing. No red flag symptoms. Patient denies melena, hematochezia, nausea, vomiting, constipation, dysphagia, odyonophagia, early satiety or weight loss.  ? ? ?Notably, last colonoscopy was in 2014 with tubular adenoma. She is over due to repeat surveillance colonoscopy, will get her scheduled for this. Indications, risks and benefits of procedure discussed in detail with patient. Patient verbalized understanding and is in agreement to proceed with Colonoscopy at this time.  ? ? ?PLAN:  ?Low  FODMAP diet ?2. Food journal x2-3 weeks ?3. Bentyl '10mg'$  TID PRN ?4. Schedule colonoscopy ?5. TSH and celiac panel ? ?All questions were answered, patient verbalized understanding and is in agreement with plan as outlined above.  ? ? ?Follow Up: ?6 months ? ?Jenifer Struve L. Alver Sorrow, MSN, APRN, AGNP-C ?Adult-Gerontology Nurse Practitioner ?[facility] ? ?

## 2021-08-22 NOTE — Patient Instructions (Addendum)
We will check thyroid function and celiac panel to rule these out as causes of your diarrhea, however, I suspect that symptoms are related to IBS. ? ?I am providing the low FODMAP diet ?You should keep a food journal for the next few weeks to help correlate if symptoms are related to certain foods ?You can try eliminating FODMAPS listed on the provided handout. If you notice no change in your symptoms, you can slowly start adding these foods/ingredients back in to see if they are in fact tolerated. You should avoid foods that tend to illicit your symptoms. ?Stress can also play a big role in IBS, so stress management is important ? ?I am also sending dicyclomine '10mg'$ , you can take this up to three times per day, as discussed some people find that they only need it 1-2x per day or just on as needed basis, you can find what works best for you ? ?We will get you set up for repeat colonoscopy ? ?Follow up 6 months ? ?

## 2021-08-23 LAB — TSH: TSH: 3.74 mIU/L (ref 0.40–4.50)

## 2021-08-23 LAB — CELIAC DISEASE PANEL
(tTG) Ab, IgA: <1 U/mL
(tTG) Ab, IgG: <1 U/mL
Gliadin IgA: 1.1 U/mL
Gliadin IgG: <1 U/mL
Immunoglobulin A: 328 mg/dL — ABNORMAL HIGH (ref 70–320)

## 2021-08-29 ENCOUNTER — Ambulatory Visit (HOSPITAL_COMMUNITY)
Admission: RE | Admit: 2021-08-29 | Discharge: 2021-08-29 | Disposition: A | Discharge status: Home / Self Care | Payer: Medicare HMO | Source: Ambulatory Visit | Attending: Internal Medicine | Admitting: Internal Medicine

## 2021-08-29 ENCOUNTER — Encounter (HOSPITAL_COMMUNITY): Payer: Self-pay

## 2021-08-29 DIAGNOSIS — R928 Other abnormal and inconclusive findings on diagnostic imaging of breast: Secondary | ICD-10-CM

## 2021-09-20 ENCOUNTER — Ambulatory Visit (HOSPITAL_BASED_OUTPATIENT_CLINIC_OR_DEPARTMENT_OTHER): Payer: Medicare HMO | Admitting: Anesthesiology

## 2021-09-20 ENCOUNTER — Encounter (HOSPITAL_COMMUNITY)
Admission: RE | Disposition: A | Discharge status: Home / Self Care | Payer: Self-pay | Source: Home / Self Care | Attending: Gastroenterology

## 2021-09-20 ENCOUNTER — Encounter (HOSPITAL_COMMUNITY): Payer: Self-pay | Admitting: Gastroenterology

## 2021-09-20 ENCOUNTER — Encounter (INDEPENDENT_AMBULATORY_CARE_PROVIDER_SITE_OTHER): Payer: Self-pay | Admitting: *Deleted

## 2021-09-20 ENCOUNTER — Ambulatory Visit (HOSPITAL_COMMUNITY)
Admission: RE | Admit: 2021-09-20 | Discharge: 2021-09-20 | Disposition: A | Discharge status: Home / Self Care | Payer: Medicare HMO | Attending: Gastroenterology | Admitting: Gastroenterology

## 2021-09-20 ENCOUNTER — Other Ambulatory Visit: Payer: Self-pay

## 2021-09-20 ENCOUNTER — Ambulatory Visit (HOSPITAL_COMMUNITY): Payer: Medicare HMO | Admitting: Anesthesiology

## 2021-09-20 DIAGNOSIS — D123 Benign neoplasm of transverse colon: Secondary | ICD-10-CM | POA: Insufficient documentation

## 2021-09-20 DIAGNOSIS — D120 Benign neoplasm of cecum: Secondary | ICD-10-CM | POA: Diagnosis not present

## 2021-09-20 DIAGNOSIS — R197 Diarrhea, unspecified: Secondary | ICD-10-CM

## 2021-09-20 DIAGNOSIS — E7800 Pure hypercholesterolemia, unspecified: Secondary | ICD-10-CM | POA: Diagnosis not present

## 2021-09-20 DIAGNOSIS — D126 Benign neoplasm of colon, unspecified: Secondary | ICD-10-CM

## 2021-09-20 DIAGNOSIS — K635 Polyp of colon: Secondary | ICD-10-CM

## 2021-09-20 HISTORY — PX: POLYPECTOMY: SHX5525

## 2021-09-20 HISTORY — DX: Other seasonal allergic rhinitis: J30.2

## 2021-09-20 HISTORY — PX: COLONOSCOPY WITH PROPOFOL: SHX5780

## 2021-09-20 HISTORY — PX: BIOPSY: SHX5522

## 2021-09-20 LAB — HM COLONOSCOPY

## 2021-09-20 SURGERY — COLONOSCOPY WITH PROPOFOL
Anesthesia: General

## 2021-09-20 MED ORDER — PROPOFOL 1000 MG/100ML IV EMUL
INTRAVENOUS | Status: AC
Start: 2021-09-20 — End: ?
  Filled 2021-09-20: qty 100

## 2021-09-20 MED ORDER — PROPOFOL 500 MG/50ML IV EMUL
INTRAVENOUS | Status: DC | PRN
  Administered 2021-09-20: 150 ug/kg/min via INTRAVENOUS

## 2021-09-20 MED ORDER — PROPOFOL 10 MG/ML IV BOLUS
INTRAVENOUS | Status: DC | PRN
Start: 2021-09-20 — End: 2021-09-20
  Administered 2021-09-20: 60 mg via INTRAVENOUS

## 2021-09-20 MED ORDER — LACTATED RINGERS IV SOLN: INTRAVENOUS | Status: DC

## 2021-09-20 NOTE — Interval H&P Note (Signed)
History and Physical Interval Note: ? ?09/20/2021 ?9:04 AM ? ?Patient has presented persistent diarrhea.  Had negative celiac disease panel and normal TSH.  We will proceed with colonoscopy as scheduled but we we will also obtain random colonic biopsies for evaluation of persistent diarrhea. ? ?Jessica Dominguez  has presented today for surgery, with the diagnosis of hx of tubular adenoma.  The various methods of treatment have been discussed with the patient and family. After consideration of risks, benefits and other options for treatment, the patient has consented to  Procedure(s) with comments: ?COLONOSCOPY WITH PROPOFOL (N/A) - 1020 as a surgical intervention.  The patient's history has been reviewed, patient examined, no change in status, stable for surgery.  I have reviewed the patient's chart and labs.  Questions were answered to the patient's satisfaction.   ? ? ?Jessica Dominguez ? ? ?

## 2021-09-20 NOTE — Anesthesia Postprocedure Evaluation (Signed)
Anesthesia Post Note ? ?Patient: Jessica Dominguez ? ?Procedure(s) Performed: COLONOSCOPY WITH PROPOFOL ?POLYPECTOMY ?BIOPSY ? ?Patient location during evaluation: Endoscopy ?Anesthesia Type: General ?Level of consciousness: awake and alert and oriented ?Pain management: pain level controlled ?Vital Signs Assessment: post-procedure vital signs reviewed and stable ?Respiratory status: spontaneous breathing, nonlabored ventilation and respiratory function stable ?Cardiovascular status: blood pressure returned to baseline and stable ?Postop Assessment: no apparent nausea or vomiting ?Anesthetic complications: no ? ? ?No notable events documented. ? ? ?Last Vitals:  ?Vitals:  ? 09/20/21 0858 09/20/21 0943  ?BP: (!) 133/58 (!) 105/52  ?Pulse: 64   ?Resp: 19 (!) 22  ?Temp: 36.4 ?C 36.4 ?C  ?SpO2: 98% 100%  ?  ?Last Pain:  ?Vitals:  ? 09/20/21 0943  ?TempSrc: Oral  ?PainSc: 0-No pain  ? ? ?  ?  ?  ?  ?  ?  ? ?Jsiah Menta C [place] ? ? ? ? ?

## 2021-09-20 NOTE — Transfer of Care (Signed)
Immediate Anesthesia Transfer of Care Note ? ?Patient: Jessica Dominguez ? ?Procedure(s) Performed: COLONOSCOPY WITH PROPOFOL ?POLYPECTOMY ?BIOPSY ? ?Patient Location: PACU ? ?Anesthesia Type:General ? ?Level of Consciousness: awake, alert  and oriented ? ?Airway & Oxygen Therapy: Patient Spontanous Breathing ? ?Post-op Assessment: Report given to RN, Post -op Vital signs reviewed and stable, Patient moving all extremities X 4 and Patient able to stick tongue midline ? ?Post vital signs: Reviewed ? ?Last Vitals:  ?Vitals Value Taken Time  ?BP 105/52   ?Temp 97.6   ?Pulse 62   ?Resp 23   ?SpO2 99   ? ? ?Last Pain:  ?Vitals:  ? 09/20/21 0913  ?TempSrc:   ?PainSc: 0-No pain  ?   ? ?Patients Stated Pain Goal: 5 (09/20/21 2919) ? ?Complications: No notable events documented. ?

## 2021-09-20 NOTE — Anesthesia Preprocedure Evaluation (Signed)
Anesthesia Evaluation  ?Patient identified by MRN, date of birth, ID band ?Patient awake ? ? ? ?Reviewed: ?Allergy & Precautions, NPO status , Patient's Chart, lab work & pertinent test results ? ?Airway ?Mallampati: III ? ?TM Distance: >3 FB ?Neck ROM: Full ? ? ? Dental ? ?(+) Dental Advisory Given, Caps ?  ?Pulmonary ?neg pulmonary ROS,  ?  ?Pulmonary exam normal ?breath sounds clear to auscultation ? ? ? ? ? ? Cardiovascular ?negative cardio ROS ?Normal cardiovascular exam ?Rhythm:Regular Rate:Normal ? ? ?  ?Neuro/Psych ?negative neurological ROS ? negative psych ROS  ? GI/Hepatic ?negative GI ROS, Neg liver ROS,   ?Endo/Other  ?negative endocrine ROS ? Renal/GU ?negative Renal ROS  ?negative genitourinary ?  ?Musculoskeletal ?negative musculoskeletal ROS ?(+)  ? Abdominal ?  ?Peds ?negative pediatric ROS ?(+)  Hematology ?negative hematology ROS ?(+)   ?Anesthesia Other Findings ? ? Reproductive/Obstetrics ?negative OB ROS ? ?  ? ? ? ? ? ? ? ? ? ? ? ? ? ?  ?  ? ? ? ? ? ? ? ? ?Anesthesia Physical ?Anesthesia Plan ? ?ASA: 2 ? ?Anesthesia Plan: General  ? ?Post-op Pain Management: Minimal or no pain anticipated  ? ?Induction: Intravenous ? ?PONV Risk Score and Plan: Propofol infusion ? ?Airway Management Planned: Nasal Cannula and Natural Airway ? ?Additional Equipment:  ? ?Intra-op Plan:  ? ?Post-operative Plan:  ? ?Informed Consent: I have reviewed the patients History and Physical, chart, labs and discussed the procedure including the risks, benefits and alternatives for the proposed anesthesia with the patient or authorized representative who has indicated his/her understanding and acceptance.  ? ? ? ?Dental advisory given ? ?Plan Discussed with: CRNA and Surgeon ? ?Anesthesia Plan Comments:   ? ? ? ? ? ? ?Anesthesia Quick Evaluation ? ?

## 2021-09-20 NOTE — Discharge Instructions (Signed)
You are being discharged to home.  Resume your previous diet.  We are waiting for your pathology results.  Your physician has recommended a repeat colonoscopy for surveillance based on pathology results.  

## 2021-09-20 NOTE — Op Note (Signed)
[facility] ?Patient Name: Jessica Dominguez ?Procedure Date: 09/20/2021 8:57 AM ?MRN: [id number] ?Date of Birth: August 28, 1945 ?Attending MD: Maylon Peppers ,  ?CSN: [id number] ?Age: 76 ?Admit Type: Outpatient ?Procedure:                Colonoscopy ?Indications:              Clinically significant diarrhea of unexplained  ?                          origin ?Providers:                Maylon Peppers, Lambert Mody, Kristine L.  ?                          Risa Grill, Technician ?Referring MD:              ?Medicines:                Monitored Anesthesia Care ?Complications:            No immediate complications. ?Estimated Blood Loss:     Estimated blood loss: none. ?Procedure:                Pre-Anesthesia Assessment: ?                          - Prior to the procedure, a History and Physical  ?                          was performed, and patient medications, allergies  ?                          and sensitivities were reviewed. The patient's  ?                          tolerance of previous anesthesia was reviewed. ?                          - The risks and benefits of the procedure and the  ?                          sedation options and risks were discussed with the  ?                          patient. All questions were answered and informed  ?                          consent was obtained. ?                          - ASA Grade Assessment: II - A patient with mild  ?                          systemic disease. ?                          After obtaining informed consent, the colonoscope  ?  was passed under direct vision. Throughout the  ?                          procedure, the patient's blood pressure, pulse, and  ?                          oxygen saturations were monitored continuously. The  ?                          PCF-HQ190L ([id number]) scope was introduced through  ?                          the anus and advanced to the the cecum, identified  ?                          by  appendiceal orifice and ileocecal valve. The  ?                          colonoscopy was performed without difficulty. The  ?                          patient tolerated the procedure well. ?Scope In: 9:13:32 AM ?Scope Out: 9:39:42 AM ?Scope Withdrawal Time: 0 hours 21 minutes 53 seconds  ?Total Procedure Duration: 0 hours 26 minutes 10 seconds  ?Findings: ?     The perianal and digital rectal examinations were normal. ?     A 3 mm polyp was found in the cecum. The polyp was sessile. The polyp  ?     was removed with a cold snare. Resection and retrieval were complete. ?     A 2 mm polyp was found in the transverse colon. The polyp was sessile.  ?     The polyp was removed with a cold biopsy forceps. Resection and  ?     retrieval were complete. ?     The rest of the colon appeared normal. Biopsies for histology were taken  ?     with a cold forceps from the right colon and left colon for evaluation  ?     of microscopic colitis. ?     The retroflexed view of the distal rectum and anal verge was normal and  ?     showed no anal or rectal abnormalities. ?Impression:               - One 3 mm polyp in the cecum, removed with a cold  ?                          snare. Resected and retrieved. ?                          - One 2 mm polyp in the transverse colon, removed  ?                          with a cold biopsy forceps. Resected and retrieved. ?                          - The rest  of the examined colon is normal.  ?                          Biopsied. ?                          - The distal rectum and anal verge are normal on  ?                          retroflexion view. ?Moderate Sedation: ?     Per Anesthesia Care ?Recommendation:           - Discharge patient to home (ambulatory). ?                          - Resume previous diet. ?                          - Await pathology results. ?                          - Repeat colonoscopy for surveillance based on  ?                          pathology results. ?Procedure  Code(s):        --- Professional --- ?                          [phone], Colonoscopy, flexible; with removal of  ?                          tumor(s), polyp(s), or other lesion(s) by snare  ?                          technique ?                          45380, 59, Colonoscopy, flexible; with biopsy,  ?                          single or multiple ?Diagnosis Code(s):        --- Professional --- ?                          K63.5, Polyp of colon ?                          R19.7, Diarrhea, unspecified ?CPT copyright 2019 American Medical Association. All rights reserved. ?The codes documented in this report are preliminary and upon coder review may  ?be revised to meet current compliance requirements. ?Maylon Peppers, MD ?Maylon Peppers,  ?09/20/2021 9:50:01 AM ?This report has been signed electronically. ?Number of Addenda: 0 ?

## 2021-09-21 LAB — SURGICAL PATHOLOGY

## 2021-09-28 ENCOUNTER — Encounter (HOSPITAL_COMMUNITY): Payer: Self-pay | Admitting: Gastroenterology

## 2022-02-22 ENCOUNTER — Encounter (INDEPENDENT_AMBULATORY_CARE_PROVIDER_SITE_OTHER): Payer: Self-pay | Admitting: Gastroenterology

## 2022-02-22 ENCOUNTER — Ambulatory Visit (INDEPENDENT_AMBULATORY_CARE_PROVIDER_SITE_OTHER): Payer: Medicare HMO | Admitting: Gastroenterology

## 2022-02-22 VITALS — BP 133/81 | HR 70 | Temp 98.1°F | Ht 62.0 in | Wt 150.2 lb

## 2022-02-22 DIAGNOSIS — K580 Irritable bowel syndrome with diarrhea: Secondary | ICD-10-CM | POA: Diagnosis not present

## 2022-02-22 NOTE — Patient Instructions (Signed)
I'm so glad you are doing better! Let's continue with dietary changes with the low FODMAP diet and using dicyclomine as needed. We will plan to see you back in 1 year unless you have new or worsening GI symptoms

## 2022-02-22 NOTE — Progress Notes (Addendum)
Referring Provider: Asencion Noble, MD Primary Care Physician:  Asencion Noble, MD Primary GI Physician: Jenetta Downer   Chief Complaint  Patient presents with   Follow-up    Patient here today for a follow up on her diarrhea issues. She says it has been well controlled as of late, with watching her diet.   HPI:   Jessica Dominguez is a 76 y.o. female with past medical history of high cholesterol   Patient presenting today for follow up of diarrhea.   Last seen April 2023, at that time having lowera bdominal pain, fecal urgency and 2-3 looser stools per day. Felt caffeine worsened her symptoms, using otc anti diarrheal with some relief. Last TCS in 2014 with tubular adenoma.  Recommended low FODMAP food guide, food journal x2-3 weeks, bentyl '10mg'$  TID PRN, TSH and celiac panel and update colonoscopy.  Celiac testing negative, TSh 3.74. colonoscopy as outlined below   Present:  Feels that symptoms are relatively well controlled and doing much better with implementation of low FODMAP diet and cutting out trigger foods. She has made a lot of dietary changes. Having 1-2 BMs per day ranging from loose to solid but usually has these in the morning after getting up and having breakfast. Fecal urgency from previously has resolved. She uses dicyclomine on occasion if she is going out to eat which works well for her. Tries not to overeat as well which seems to help. Denies rectal bleeding, nausea, vomiting. Weight and appetite are stable. Has minimal abdominal discomfort.   Last Colonoscopy:09/20/21 - One 3 mm polyp in the cecum, removed with a cold snare. Resected and retrieved. - One 2 mm polyp in the transverse colon-2 tubular adenomas  - The rest of the examined colon is normal. Biopsied-normal  - The distal rectum and anal verge are normal on retroflexion view. Last Endoscopy: never  Recommendations:  Repeat colonoscopy 7 years   Past Medical History:  Diagnosis Date   Hypercholesteremia     Seasonal allergies     Past Surgical History:  Procedure Laterality Date   BIOPSY  09/20/2021   Procedure: BIOPSY;  Surgeon: Harvel Quale, MD;  [facility];  Service: Gastroenterology;;   COLONOSCOPY     COLONOSCOPY N/A 09/25/2012   Procedure: COLONOSCOPY;  Surgeon: Rogene Houston, MD;  [facility];  Service: Endoscopy;  Laterality: N/A;  830   COLONOSCOPY WITH PROPOFOL N/A 09/20/2021   Procedure: COLONOSCOPY WITH PROPOFOL;  Surgeon: Harvel Quale, MD;  [facility];  Service: Gastroenterology;  Laterality: N/A;  1020   EYE SURGERY     POLYPECTOMY  09/20/2021   Procedure: POLYPECTOMY;  Surgeon: Montez Morita, Quillian Quince, MD;  [facility];  Service: Gastroenterology;;    Current Outpatient Medications  Medication Sig Dispense Refill   acetaminophen (TYLENOL) 500 MG tablet Take 1,000 mg by mouth every 6 (six) hours as needed for moderate pain.     dicyclomine (BENTYL) 10 MG capsule Take 1 capsule (10 mg total) by mouth 3 (three) times daily as needed for spasms. 90 capsule 1   diphenhydramine-acetaminophen (TYLENOL PM) 25-500 MG TABS tablet Take 1 tablet by mouth at bedtime as needed (sleep).     loperamide (IMODIUM A-D) 2 MG tablet Take 2 mg by mouth 4 (four) times daily as needed for diarrhea or loose stools.     No current facility-administered medications for this visit.    Allergies as of 02/22/2022   (No Known Allergies)  Family History  Problem Relation Age of Onset   Colon cancer Neg Hx     Social History   Socioeconomic History   Marital status: Married    Spouse name: Not on file   Number of children: Not on file   Years of education: Not on file   Highest education level: Not on file  Occupational History   Not on file  Tobacco Use   Smoking status: Never   Smokeless tobacco: Not on file  Vaping Use   Vaping Use: Never used  Substance and Sexual Activity   Alcohol use: Yes    Comment:  occasionally   Drug use: No   Sexual activity: Not on file  Other Topics Concern   Not on file  Social History Narrative   Not on file   Social Determinants of Health   Financial Resource Strain: Not on file  Food Insecurity: Not on file  Transportation Needs: Not on file  Physical Activity: Not on file  Stress: Not on file  Social Connections: Not on file   Review of systems General: negative for malaise, night sweats, fever, chills, weight loss Neck: Negative for lumps, goiter, pain and significant neck swelling Resp: Negative for cough, wheezing, dyspnea at rest CV: Negative for chest pain, leg swelling, palpitations, orthopnea GI: denies melena, hematochezia, nausea, vomiting, diarrhea, constipation, dysphagia, odyonophagia, early satiety or unintentional weight loss.  MSK: Negative for joint pain or swelling, back pain, and muscle pain. Derm: Negative for itching or rash Psych: Denies depression, anxiety, memory loss, confusion. No homicidal or suicidal ideation.  Heme: Negative for prolonged bleeding, bruising easily, and swollen nodes. Endocrine: Negative for cold or heat intolerance, polyuria, polydipsia and goiter. Neuro: negative for tremor, gait imbalance, syncope and seizures. The remainder of the review of systems is noncontributory.  Physical Exam: BP 133/81 (BP Location: Left Arm, Patient Position: Sitting, Cuff Size: Small)   Pulse 70   Temp 98.1 F (36.7 C) (Oral)   Ht '5\' 2"'$  (1.575 m)   Wt 150 lb 3.2 oz (68.1 kg)   BMI 27.47 kg/m  General:   Alert and oriented. No distress noted. Pleasant and cooperative.  Head:  Normocephalic and atraumatic. Eyes:  Conjuctiva clear without scleral icterus. Mouth:  Oral mucosa pink and moist. Good dentition. No lesions. Heart: Normal rate and rhythm, s1 and s2 heart sounds present.  Lungs: Clear lung sounds in all lobes. Respirations equal and unlabored. Abdomen:  +BS, soft, non-tender and non-distended. No rebound or  guarding. No HSM or masses noted. Derm: No palmar erythema or jaundice Msk:  Symmetrical without gross deformities. Normal posture. Extremities:  Without edema. Neurologic:  Alert and  oriented x4 Psych:  Alert and cooperative. Normal mood and affect.  Invalid input(s): "6 MONTHS"   ASSESSMENT: Jessica Dominguez is a 76 y.o. female presenting today for follow up of diarrhea, abdominal cramping and fecal urgency thought secondary to IBS.   Patient reports doing much better today. She has utilized low FODMAP diet to recognize triggers and is avoiding those foods. She notes 1-2 BMs per day, usually after breakfast, can range from loose to solid though fecal urgency is resolved. She has minimal abdominal pain. Uses dicyclomine PRN if she is going out somewhere with good results. She tries to avoid over eating which also has helped with her symptoms. She denies any red flag symptoms. Should continue with current dietary measure and using anti spasmodics on as needed basis with good results.  PLAN:  Continue dicyclomine PRN  2. Low FODMAP diet/avoiding known triggers   All questions were answered, patient verbalized understanding and is in agreement with plan as outlined above.    Follow Up: 1 year   Kajsa Butrum L. Alver Sorrow, MSN, APRN, AGNP-C Adult-Gerontology Nurse Practitioner [facility]  I have reviewed the note and agree with the APP's assessment as described in this progress note  Maylon Peppers, MD Gastroenterology and Hepatology [facility]

## 2022-06-25 ENCOUNTER — Other Ambulatory Visit (HOSPITAL_COMMUNITY): Payer: Self-pay | Admitting: Internal Medicine

## 2022-06-25 DIAGNOSIS — E785 Hyperlipidemia, unspecified: Secondary | ICD-10-CM

## 2022-08-09 ENCOUNTER — Ambulatory Visit (HOSPITAL_COMMUNITY)
Admission: RE | Admit: 2022-08-09 | Discharge: 2022-08-09 | Disposition: A | Discharge status: Home / Self Care | Payer: Self-pay | Source: Ambulatory Visit | Attending: Internal Medicine | Admitting: Internal Medicine

## 2022-08-09 DIAGNOSIS — E785 Hyperlipidemia, unspecified: Secondary | ICD-10-CM | POA: Insufficient documentation

## 2022-08-20 ENCOUNTER — Other Ambulatory Visit (HOSPITAL_COMMUNITY): Payer: Self-pay | Admitting: Internal Medicine

## 2022-08-20 DIAGNOSIS — Z1231 Encounter for screening mammogram for malignant neoplasm of breast: Secondary | ICD-10-CM

## 2022-09-03 ENCOUNTER — Ambulatory Visit (HOSPITAL_COMMUNITY)
Admission: RE | Admit: 2022-09-03 | Discharge: 2022-09-03 | Disposition: A | Discharge status: Home / Self Care | Payer: Medicare HMO | Source: Ambulatory Visit | Attending: Internal Medicine | Admitting: Internal Medicine

## 2022-09-03 ENCOUNTER — Encounter (HOSPITAL_COMMUNITY): Payer: Self-pay

## 2022-09-03 DIAGNOSIS — Z1231 Encounter for screening mammogram for malignant neoplasm of breast: Secondary | ICD-10-CM | POA: Insufficient documentation

## 2023-02-26 ENCOUNTER — Ambulatory Visit (INDEPENDENT_AMBULATORY_CARE_PROVIDER_SITE_OTHER): Payer: Medicare HMO | Admitting: Gastroenterology

## 2023-02-26 ENCOUNTER — Encounter (INDEPENDENT_AMBULATORY_CARE_PROVIDER_SITE_OTHER): Payer: Self-pay | Admitting: Gastroenterology

## 2023-02-26 VITALS — BP 127/69 | HR 80 | Temp 97.5°F | Ht 62.0 in | Wt 144.4 lb

## 2023-02-26 DIAGNOSIS — K580 Irritable bowel syndrome with diarrhea: Secondary | ICD-10-CM | POA: Diagnosis not present

## 2023-02-26 NOTE — Progress Notes (Addendum)
Referring Provider: Carylon Perches, MD Primary Care Physician:  Carylon Perches, MD Primary GI Physician: Dr. Levon Hedger   Chief Complaint  Patient presents with   Follow-up    Patient here today for a yearly follow up. Patient denies any current issues.    HPI:   Jessica Dominguez is a 77 y.o. female with past medical history of high cholesterol   Patient presenting today for follow up of diarrhea.  Previous testing to include TSH and celiac panel were normal   Last seen October 2023, at that time symptoms better controlled with low FODMAP and cutting out trigger foods. Using dicyclomine on occasion. Having 1-2 solid to looser stools. Fecal urgency improved.   Advised to continue with dicyclomine PRN, low FODMAP   Present: Patient states 1 Bm per day, usually loose, if she doesn't have coffee, stools are less loose. She has rare solid stools. She tries to avoid trigger foods such as broccoli and brussell sprouts. No rectal bleeding or melena. Appetite is good. She denies nausea or vomiting. Has lost 6 pounds since her last visit in October 2023, noting she has not been eating quite as much as she has not be very hungry. She has also stopped drinking 1/2 and 1/2 sweet tea, drinking more water. She is not taking dicyclomine very often at all but notes her insurance stopped covering this after her last refill. She has a full bottle at home currently.    Last Colonoscopy:09/20/21 - One 3 mm polyp in the cecum, removed with a cold snare. Resected and retrieved. - One 2 mm polyp in the transverse colon-2 tubular adenomas  - The rest of the examined colon is normal. Biopsied-normal  - The distal rectum and anal verge are normal on retroflexion view. Last Endoscopy: never   Recommendations:  Repeat colonoscopy 7 years    Past Medical History:  Diagnosis Date   Hypercholesteremia    Seasonal allergies     Past Surgical History:  Procedure Laterality Date   BIOPSY  09/20/2021   Procedure:  BIOPSY;  Surgeon: Dolores Frame, MD;  [facility];  Service: Gastroenterology;;   COLONOSCOPY     COLONOSCOPY N/A 09/25/2012   Procedure: COLONOSCOPY;  Surgeon: Malissa Hippo, MD;  [facility];  Service: Endoscopy;  Laterality: N/A;  830   COLONOSCOPY WITH PROPOFOL N/A 09/20/2021   Procedure: COLONOSCOPY WITH PROPOFOL;  Surgeon: Dolores Frame, MD;  [facility];  Service: Gastroenterology;  Laterality: N/A;  1020   EYE SURGERY     POLYPECTOMY  09/20/2021   Procedure: POLYPECTOMY;  Surgeon: Marguerita Merles, Reuel Boom, MD;  [facility];  Service: Gastroenterology;;    Current Outpatient Medications  Medication Sig Dispense Refill   acetaminophen (TYLENOL) 500 MG tablet Take 1,000 mg by mouth every 6 (six) hours as needed for moderate pain.     dicyclomine (BENTYL) 10 MG capsule Take 1 capsule (10 mg total) by mouth 3 (three) times daily as needed for spasms. 90 capsule 1   diphenhydramine-acetaminophen (TYLENOL PM) 25-500 MG TABS tablet Take 1 tablet by mouth at bedtime as needed (sleep).     loperamide (IMODIUM A-D) 2 MG tablet Take 2 mg by mouth 4 (four) times daily as needed for diarrhea or loose stools.     rosuvastatin (CRESTOR) 20 MG tablet Take 20 mg by mouth daily.     No current facility-administered medications for this visit.    Allergies as of 02/26/2023   (  No Known Allergies)    Family History  Problem Relation Age of Onset   Colon cancer Neg Hx     Social History   Socioeconomic History   Marital status: Married    Spouse name: Not on file   Number of children: Not on file   Years of education: Not on file   Highest education level: Not on file  Occupational History   Not on file  Tobacco Use   Smoking status: Never   Smokeless tobacco: Not on file  Vaping Use   Vaping status: Never Used  Substance and Sexual Activity   Alcohol use: Yes    Comment: occasionally   Drug use: No   Sexual  activity: Not on file  Other Topics Concern   Not on file  Social History Narrative   Not on file   Social Determinants of Health   Financial Resource Strain: Not on file  Food Insecurity: Not on file  Transportation Needs: Not on file  Physical Activity: Not on file  Stress: Not on file  Social Connections: Not on file    Review of systems General: negative for malaise, night sweats, fever, chills, weight loss Neck: Negative for lumps, goiter, pain and significant neck swelling Resp: Negative for cough, wheezing, dyspnea at rest CV: Negative for chest pain, leg swelling, palpitations, orthopnea GI: denies melena, hematochezia, nausea, vomiting, diarrhea, constipation, dysphagia, odyonophagia, early satiety or unintentional weight loss. +loose stools  MSK: Negative for joint pain or swelling, back pain, and muscle pain. Derm: Negative for itching or rash Psych: Denies depression, anxiety, memory loss, confusion. No homicidal or suicidal ideation.  Heme: Negative for prolonged bleeding, bruising easily, and swollen nodes. Endocrine: Negative for cold or heat intolerance, polyuria, polydipsia and goiter. Neuro: negative for tremor, gait imbalance, syncope and seizures. The remainder of the review of systems is noncontributory.  Physical Exam: BP 127/69 (BP Location: Left Arm, Patient Position: Sitting, Cuff Size: Normal)   Pulse 80   Temp (!) 97.5 F (36.4 C) (Temporal)   Ht 5\' 2"  (1.575 m)   Wt 144 lb 6.4 oz (65.5 kg)   BMI 26.41 kg/m  General:   Alert and oriented. No distress noted. Pleasant and cooperative.  Head:  Normocephalic and atraumatic. Eyes:  Conjuctiva clear without scleral icterus. Mouth:  Oral mucosa pink and moist. Good dentition. No lesions. Heart: Normal rate and rhythm, s1 and s2 heart sounds present.  Lungs: Clear lung sounds in all lobes. Respirations equal and unlabored. Abdomen:  +BS, soft, non-tender and non-distended. No rebound or guarding. No HSM  or masses noted. Derm: No palmar erythema or jaundice Msk:  Symmetrical without gross deformities. Normal posture. Extremities:  Without edema. Neurologic:  Alert and  oriented x4 Psych:  Alert and cooperative. Normal mood and affect.  Invalid input(s): "6 MONTHS"   ASSESSMENT: Jessica Dominguez is a 77 y.o. female presenting today for follow up of looser stools though secondary to IBS.  Doing well with 1 BM per day. Stools still loose, notes certain foods and coffee tend to worsen her loose stools, she avoid trigger foods but still enjoys one cup of coffee per day. Denies abdominal pain, rectal bleeding, melena. Has had a 6 pound weight loss over the past year but has also cut out sweet teat and is drinking more water. Using dicyclomine very rarely. Recommend she continue to avoid trigger foods, may benefit from starting daily probiotic, aim for one with atleast 2-3 strains of bacteria for best  results, can also try implementing benefiber to help thicken up her stools.    PLAN:  Continue to avoid trigger foods  2. Can start daily probiotic  3. Benefiber 1T with a meal, can increase to up to 3T per day  All questions were answered, patient verbalized understanding and is in agreement with plan as outlined above.   Follow Up: 1 year   Roxsana Riding L. Jeanmarie Hubert, MSN, APRN, AGNP-C Adult-Gerontology Nurse Practitioner [facility]  I have reviewed the note and agree with the APP's assessment as described in this progress note  Katrinka Blazing, MD Gastroenterology and Hepatology [facility]

## 2023-02-26 NOTE — Patient Instructions (Signed)
Continue to avoid trigger foods and be mindful of foods on low FODMAP guide You can try a daily probiotic, look for one with atleast 2-3 strains of bacteria in it for best results You may also try adding benefiber 1T with a meal, you can increase to up to 3T per day. This may help to thicken up your stools  If you have any new or worsening GI symptoms  Follow up 1 year  It was a pleasure to see you today. I want to create trusting relationships with patients and provide genuine, compassionate, and quality care. I truly value your feedback! please be on the lookout for a survey regarding your visit with me today. I appreciate your input about our visit and your time in completing this!    Adelena Desantiago L. Jeanmarie Hubert, MSN, APRN, AGNP-C Adult-Gerontology Nurse Practitioner [facility] at [facility]

## 2023-07-30 ENCOUNTER — Other Ambulatory Visit (HOSPITAL_COMMUNITY): Payer: Self-pay | Admitting: Internal Medicine

## 2023-07-30 DIAGNOSIS — Z1231 Encounter for screening mammogram for malignant neoplasm of breast: Secondary | ICD-10-CM

## 2023-09-13 ENCOUNTER — Encounter (HOSPITAL_COMMUNITY): Payer: Self-pay

## 2023-09-13 ENCOUNTER — Ambulatory Visit (HOSPITAL_COMMUNITY)
Admission: RE | Admit: 2023-09-13 | Discharge: 2023-09-13 | Disposition: A | Discharge status: Home / Self Care | Source: Ambulatory Visit | Attending: Internal Medicine | Admitting: Internal Medicine

## 2023-09-13 DIAGNOSIS — Z1231 Encounter for screening mammogram for malignant neoplasm of breast: Secondary | ICD-10-CM | POA: Insufficient documentation

## 2024-02-27 ENCOUNTER — Encounter (INDEPENDENT_AMBULATORY_CARE_PROVIDER_SITE_OTHER): Payer: Self-pay | Admitting: Gastroenterology

## 2024-02-27 ENCOUNTER — Ambulatory Visit (INDEPENDENT_AMBULATORY_CARE_PROVIDER_SITE_OTHER): Payer: Medicare HMO | Admitting: Gastroenterology

## 2024-02-27 VITALS — BP 133/71 | HR 60 | Temp 97.2°F | Ht 63.0 in | Wt 150.2 lb

## 2024-02-27 DIAGNOSIS — Z860101 Personal history of adenomatous and serrated colon polyps: Secondary | ICD-10-CM

## 2024-02-27 DIAGNOSIS — K580 Irritable bowel syndrome with diarrhea: Secondary | ICD-10-CM | POA: Diagnosis not present

## 2024-02-27 NOTE — Progress Notes (Addendum)
 Referring Provider: Sheryle Carwin, MD Primary Care Physician:  Sheryle Carwin, MD Primary GI Physician: Dr. Eartha   Chief Complaint  Patient presents with   Follow-up   HPI:   Jessica Dominguez is a 78 y.o. female with past medical history of high cholesterol  Patient presenting today for:  Follow up of IBS-D  Last seen October 2024, at that time having 1 looser stool per day, stools are more formed when she avoid coffee and certain other trigger foods. Not taking bentyl  as insurance was not covering it  Recommended start daily probiotic, start benefiber, continue to avoid trigger foods  Present: States she generally has 1-2 BM per day with looser stools. She states yesterday she had more abdominal cramping and some looser stools yesterday, she thinks it was related to the asparagus she ate. She took some pepto which seemed to help. She denies rectal bleeding, melena, nausea, vomiting. She states she is unsure what to eat as certain veggies seem to bother her the most. She denies any issues with breads or meats. She notes that she has some cramping, gas at times. Defecation seems to improve her abdominal cramping. She is taking a daily probiotic, using benefiber 1T daily which she has not seem much improvement with. She takes bentyl  as needed but tires to use sparingly as insurance would not longer cover this. She takes imodium maybe a few times per month, usually just takes 1 which seems to help.   Previous workup: Negative celiac panel in April 2023 TSH 3.74  Last Colonoscopy:09/20/21 - One 3 mm polyp in the cecum, removed with a cold snare. Resected and retrieved. - One 2 mm polyp in the transverse colon-2 tubular adenomas  - The rest of the examined colon is normal. Biopsied-normal  - The distal rectum and anal verge are normal on retroflexion view. Last Endoscopy: never   Recommendations:  Repeat colonoscopy 7 years   Past Medical History:  Diagnosis Date   Hypercholesteremia     Seasonal allergies     Past Surgical History:  Procedure Laterality Date   BIOPSY  09/20/2021   Procedure: BIOPSY;  Surgeon: Eartha Angelia Sieving, MD;  Location: [facility];  Service: Gastroenterology;;   COLONOSCOPY     COLONOSCOPY N/A 09/25/2012   Procedure: COLONOSCOPY;  Surgeon: Claudis RAYMOND Rivet, MD;  Location: [facility];  Service: Endoscopy;  Laterality: N/A;  830   COLONOSCOPY WITH PROPOFOL  N/A 09/20/2021   Procedure: COLONOSCOPY WITH PROPOFOL ;  Surgeon: Eartha Angelia Sieving, MD;  Location: [facility];  Service: Gastroenterology;  Laterality: N/A;  1020   EYE SURGERY     POLYPECTOMY  09/20/2021   Procedure: POLYPECTOMY;  Surgeon: Eartha Angelia, Sieving, MD;  Location: [facility];  Service: Gastroenterology;;    Current Outpatient Medications  Medication Sig Dispense Refill   acetaminophen (TYLENOL) 500 MG tablet Take 1,000 mg by mouth every 6 (six) hours as needed for moderate pain.     celecoxib (CELEBREX) 200 MG capsule Take 200 mg by mouth daily.     dicyclomine  (BENTYL ) 10 MG capsule Take 1 capsule (10 mg total) by mouth 3 (three) times daily as needed for spasms. 90 capsule 1   diphenhydramine-acetaminophen (TYLENOL PM) 25-500 MG TABS tablet Take 1 tablet by mouth at bedtime as needed (sleep).     loperamide (IMODIUM A-D) 2 MG tablet Take 2 mg by mouth 4 (four) times daily as needed for diarrhea or loose stools.     rosuvastatin (CRESTOR)  20 MG tablet Take 20 mg by mouth daily.     No current facility-administered medications for this visit.    Allergies as of 02/27/2024   (No Known Allergies)    Social History   Socioeconomic History   Marital status: Married    Spouse name: Not on file   Number of children: Not on file   Years of education: Not on file   Highest education level: Not on file  Occupational History   Not on file  Tobacco Use   Smoking status: Never   Smokeless tobacco: Not on file  Vaping Use   Vaping status: Never Used   Substance and Sexual Activity   Alcohol use: Yes    Comment: occasionally   Drug use: No   Sexual activity: Not on file  Other Topics Concern   Not on file  Social History Narrative   Not on file   Social Drivers of Health   Financial Resource Strain: Not on file  Food Insecurity: Not on file  Transportation Needs: Not on file  Physical Activity: Not on file  Stress: Not on file  Social Connections: Not on file    Review of systems General: negative for malaise, night sweats, fever, chills, weight loss Neck: Negative for lumps, goiter, pain and significant neck swelling Resp: Negative for cough, wheezing, dyspnea at rest CV: Negative for chest pain, leg swelling, palpitations, orthopnea GI: denies melena, hematochezia, nausea, vomiting, constipation, dysphagia, odyonophagia, early satiety or unintentional weight loss. +loose stools +abdominal cramping  MSK: Negative for joint pain or swelling, back pain, and muscle pain. Derm: Negative for itching or rash Psych: Denies depression, anxiety, memory loss, confusion. No homicidal or suicidal ideation.  Heme: Negative for prolonged bleeding, bruising easily, and swollen nodes. Endocrine: Negative for cold or heat intolerance, polyuria, polydipsia and goiter. Neuro: negative for tremor, gait imbalance, syncope and seizures. The remainder of the review of systems is noncontributory.  Physical Exam: BP 133/71 (BP Location: Left Arm, Patient Position: Sitting, Cuff Size: Normal)   Pulse 60   Temp (!) 97.2 F (36.2 C) (Temporal)   Ht 5' 3 (1.6 m)   Wt 150 lb 3.2 oz (68.1 kg)   BMI 26.61 kg/m  General:   Alert and oriented. No distress noted. Pleasant and cooperative.  Head:  Normocephalic and atraumatic. Eyes:  Conjuctiva clear without scleral icterus. Mouth:  Oral mucosa pink and moist. Good dentition. No lesions. Heart: Normal rate and rhythm, s1 and s2 heart sounds present.  Lungs: Clear lung sounds in all lobes.  Respirations equal and unlabored. Abdomen:  +BS, soft, non-tender and non-distended. No rebound or guarding. No HSM or masses noted. Derm: No palmar erythema or jaundice Msk:  Symmetrical without gross deformities. Normal posture. Extremities:  Without edema. Neurologic:  Alert and  oriented x4 Psych:  Alert and cooperative. Normal mood and affect.  Invalid input(s): 6 MONTHS   ASSESSMENT: SHANTAI TIEDEMAN is a 78 y.o. female presenting today for follow up of IBS-D  Patient with history of intermittent loose stools and abdominal cramping that seem mostly brought on by certain dietary triggers such as coffee and certain fibrous veggies. No issues with meats or dairy. TSH WNL, celiac panel negative. Fairly recent Colonoscopy as above without findings to explain her symptoms. She does get some relief with use of bentyl  which her insurance no longer covers so she is using her last prescription sparingly. She also notes that defecation improves her pain. No rectal bleeding, melena, weight  loss. We discussed further testing (alpha gal, pancreatic elastase, fecal fat, SIBO breath testing, low suspicion for alpha gal but cannot rule this out) but patient does not wish to pursue any further testing at this time. I encouraged her to utilize low FODMAP guide, use bentyl  PRN, can try iberogast if she runs out of the bentyl , and can try increasing benefiber to 2T per day as she is currently only taking 1T daily.    PLAN:  -utilize low FODMAP guide -continue to avoid known trigger foods -can use bentyl  as needed -continue daily probiotic -can try iberogast PRN  -increase benefiber to 2T per day  -pt to make me aware if she wishes to pursue other testing   All questions were answered, patient verbalized understanding and is in agreement with plan as outlined above.   Follow Up: 1 year   Athalia Setterlund L. Mariette, MSN, APRN, AGNP-C Adult-Gerontology Nurse Practitioner [facility] [place] [facility]  I have reviewed the note and agree with the APP's assessment as described in this progress note  Toribio Fortune, MD Gastroenterology and Hepatology [facility]astroenter[facility]

## 2024-02-27 NOTE — Patient Instructions (Signed)
-  you can utilize the low FODMAP guide to help determine triggering foods and more well tolerated foods ( let me know if you need another copy of this) -can use bentyl  as needed -continue daily probiotic -you can try over the counter iberogast to help with abdominal cramping in place of bentyl  or once you run out of this since your insurance is no longer covering the bentyl  -increase benefiber to 2T per day  -let me know if you wish to do other testing for loose stools  Follow up 1 year  It was a pleasure to see you today. I want to create trusting relationships with patients and provide genuine, compassionate, and quality care. I truly value your feedback! please be on the lookout for a survey regarding your visit with me today. I appreciate your input about our visit and your time in completing this!    Jessica Fountaine L. Markees Carns, MSN, APRN, AGNP-C Adult-Gerontology Nurse Practitioner [facility]astro[facility] at [facility]

## 2024-05-05 NOTE — Patient Instructions (Signed)
 SURGICAL WAITING ROOM VISITATION Patients having surgery or a procedure may have no more than 2 support people in the waiting area - these visitors may rotate in the visitor waiting room.   If the patient needs to stay at the hospital during part of their recovery, the visitor guidelines for inpatient rooms apply.  PRE-OP VISITATION  Pre-op nurse will coordinate an appropriate time for 1 support person to accompany the patient in pre-op.  This support person may not rotate.  This visitor will be contacted when the time is appropriate for the visitor to come back in the pre-op area.  Please refer to the [facility] website for the visitor guidelines for Inpatients (after your surgery is over and you are in a regular room).  Temporary Visitor Restrictions  Children ages 23 and under will not be able to visit patients in [facility] under most circumstances. Visitation is not restricted outside of hospitals unless noted otherwise in the [facility] and Location Specific Visitation Guidelines at :      [email or web address] Visitors with respiratory illnesses are discouraged from visiting and should remain at home.  You are not required to quarantine at this time prior to your surgery. However, you must do this: Hand Hygiene often Do NOT share personal items Notify your provider if you are in close contact with someone who has COVID or you develop fever 100.4 or greater, new onset of sneezing, cough, sore throat, shortness of breath or body aches.  If you test positive for Covid or have been in contact with anyone that has tested positive in the last 10 days please notify you surgeon.    Your procedure is scheduled on:  TUESDAY 05-19-24  Report to [facility] Main Entrance: [address] entrance where the [employer] is available.   Report to admitting at:  10:45   AM  Call this number if you have any questions or problems the morning of surgery [phone]  Do not eat food  after Midnight the night prior to your surgery/procedure.  After Midnight you may have the following liquids until 10:15 AM DAY OF SURGERY  Clear Liquid Diet Water  Black Coffee (sugar ok, NO MILK/CREAM OR CREAMERS)  Tea (sugar ok, NO MILK/CREAM OR CREAMERS) regular and decaf                             Plain Jell-O  with no fruit (NO RED)                                           Fruit ices (not with fruit pulp, NO RED)                                     Popsicles (NO RED)                                                                  Juice: NO CITRUS JUICES: only apple, WHITE grape, WHITE cranberry Sports drinks like Gatorade or Powerade (NO RED)  The day of surgery:  Drink ONE (1) Pre-Surgery Clear Ensure at  10:15  AM the morning of surgery. Drink in one sitting. Do not sip.  This drink was given to you during your hospital pre-op appointment visit. Nothing else to drink after completing the Pre-Surgery Clear Ensure : No candy, chewing gum or throat lozenges.    FOLLOW ANY ADDITIONAL PRE OP INSTRUCTIONS YOU RECEIVED FROM YOUR SURGEON'S OFFICE!!!   Oral Hygiene is also important to reduce your risk of infection.        Remember - BRUSH YOUR TEETH THE MORNING OF SURGERY WITH YOUR REGULAR TOOTHPASTE  Do NOT smoke after Midnight the night before surgery.  STOP TAKING all Vitamins, Herbs and supplements 1 week before your surgery.   Take ONLY these medicines the morning of surgery with A SIP OF WATER : Tylenol if needed.                    You may not have any metal on your body including hair pins, jewelry, and body piercing  Do not wear make-up, lotions, powders, perfumes  or deodorant  Do not wear nail polish including gel and S&S, artificial / acrylic nails, or any other type of covering on natural nails including finger and toenails. If you have artificial nails, gel coating, etc., that needs to be removed by a nail salon, Please have this removed prior to  surgery. Not doing so may mean that your surgery could be cancelled or delayed if the Surgeon or anesthesia staff feels like they are unable to monitor you safely.   Do not shave 48 hours prior to surgery to avoid nicks in your skin which may contribute to postoperative infections.   Contacts, Hearing Aids, dentures or bridgework may not be worn into surgery. DENTURES WILL BE REMOVED PRIOR TO SURGERY PLEASE DO NOT APPLY Poly grip OR ADHESIVES!!!  You may bring a small overnight bag with you on the day of surgery, only pack items that are not valuable. [facility] [facility] IS NOT RESPONSIBLE   FOR VALUABLES THAT ARE LOST OR STOLEN.   Do not bring your home medications to the hospital. The Pharmacy will dispense medications listed on your medication list to you during your admission in the Hospital.  Please read over the following fact sheets you were given: IF YOU HAVE QUESTIONS ABOUT YOUR PRE-OP INSTRUCTIONS, PLEASE CALL [phone].      Pre-operative 4 CHG Bath Instructions   You can play a key role in reducing the risk of infection after surgery. Your skin needs to be as free of germs as possible. You can reduce the number of germs on your skin by washing with CHG (chlorhexidine gluconate) soap before surgery. CHG is an antiseptic soap that kills germs and continues to kill germs even after washing.   DO NOT use if you have an allergy to chlorhexidine/CHG or antibacterial soaps. If your skin becomes reddened or irritated, stop using the CHG and notify one of our RNs at [phone]  Please shower with the CHG soap starting 4 days before surgery using the following schedule: FRIDAY  05-15-2024     Do NOT use CHG soap  the morning of your                                                                                                                                 surgery.          Please keep in mind the following:  DO NOT shave, including legs and underarms, starting the day of your first shower.   You may shave your face at any point before/day of surgery.  Place clean sheets on your bed the day you start using CHG soap. Use a clean washcloth (not used since being washed) for each shower. DO NOT sleep with pets once you start using the CHG.  CHG Shower Instructions:  If you choose to wash your hair and private area, wash first with your normal shampoo/soap.  After you use shampoo/soap, rinse your hair and body thoroughly to remove shampoo/soap residue.  Turn the water  OFF and apply about 3 tablespoons (45 ml) of CHG soap to a CLEAN washcloth.  Apply CHG soap ONLY FROM YOUR NECK DOWN TO YOUR TOES (washing for 3-5 minutes)  DO NOT use CHG soap on face, private areas, open wounds, or sores.  Pay special attention to the area where your surgery is being performed.  If you are having back surgery, having someone wash your back for you may be helpful. Wait 2 minutes after CHG soap is applied, then you may rinse off the CHG soap.  Pat dry with a clean towel  Put on clean clothes/pajamas   If you choose to wear lotion, please use ONLY the CHG-compatible lotions on the back of this paper.     Additional instructions for the day of surgery: DO NOT APPLY any CHG Soap,  lotions, deodorants, cologne, or perfumes on the day of surgery  Put on clean/comfortable clothes.  Brush your teeth.  Ask your nurse before applying any prescription medications to the skin.   CHG Compatible Lotions   Aveeno Moisturizing lotion  Cetaphil Moisturizing Cream  Cetaphil Moisturizing Lotion  Clairol Herbal Essence Moisturizing Lotion, Dry Skin  Clairol Herbal Essence Moisturizing Lotion, Extra Dry Skin  Clairol Herbal Essence Moisturizing Lotion, Normal Skin  Curel Age Defying Therapeutic Moisturizing Lotion with Alpha Hydroxy  Curel Extreme Care Body Lotion  Curel Soothing Hands  Moisturizing Hand Lotion  Curel Therapeutic Moisturizing Cream, Fragrance-Free  Curel Therapeutic Moisturizing Lotion, Fragrance-Free  Curel Therapeutic Moisturizing Lotion, Original Formula  Eucerin Daily Replenishing Lotion  Eucerin Dry Skin Therapy Plus Alpha Hydroxy Crme  Eucerin Dry Skin Therapy Plus Alpha Hydroxy Lotion  Eucerin Original Crme  Eucerin Original Lotion  Eucerin Plus Crme Eucerin Plus Lotion  Eucerin TriLipid Replenishing Lotion  Keri Anti-Bacterial Hand Lotion  Keri Deep Conditioning Original Lotion Dry Skin Formula Softly Scented  Keri Deep Conditioning Original Lotion, Fragrance Free Sensitive Skin Formula  Keri Lotion Fast Absorbing Fragrance Free Sensitive Skin Formula  Keri Lotion Fast Absorbing Softly Scented Dry Skin  Formula  Keri Original Lotion  Keri Skin Renewal Lotion Keri Silky Smooth Lotion  Keri Silky Smooth Sensitive Skin Lotion  Nivea Body Creamy Conditioning Oil  Nivea Body Extra Enriched Lotion  Nivea Body Original Lotion  Nivea Body Sheer Moisturizing Lotion Nivea Crme  Nivea Skin Firming Lotion  NutraDerm 30 Skin Lotion  NutraDerm Skin Lotion  NutraDerm Therapeutic Skin Cream  NutraDerm Therapeutic Skin Lotion  ProShield Protective Hand Cream  Provon moisturizing lotion   FAILURE TO FOLLOW THESE INSTRUCTIONS MAY RESULT IN THE CANCELLATION OF YOUR SURGERY  PATIENT SIGNATURE_________________________________  NURSE SIGNATURE__________________________________  ________________________________________________________________________        Jessica Dominguez    An incentive spirometer is a tool that can help keep your lungs clear and active. This tool measures how well you are filling your lungs with each breath. Taking long deep breaths may help reverse or decrease the chance of developing breathing (pulmonary) problems (especially infection) following: A long period of time when you are unable to move or be active. BEFORE  THE PROCEDURE  If the spirometer includes an indicator to show your best effort, your nurse or respiratory therapist will set it to a desired goal. If possible, sit up straight or lean slightly forward. Try not to slouch. Hold the incentive spirometer in an upright position. INSTRUCTIONS FOR USE  Sit on the edge of your bed if possible, or sit up as far as you can in bed or on a chair. Hold the incentive spirometer in an upright position. Breathe out normally. Place the mouthpiece in your mouth and seal your lips tightly around it. Breathe in slowly and as deeply as possible, raising the piston or the ball toward the top of the column. Hold your breath for 3-5 seconds or for as long as possible. Allow the piston or ball to fall to the bottom of the column. Remove the mouthpiece from your mouth and breathe out normally. Rest for a few seconds and repeat Steps 1 through 7 at least 10 times every 1-2 hours when you are awake. Take your time and take a few normal breaths between deep breaths. The spirometer may include an indicator to show your best effort. Use the indicator as a goal to work toward during each repetition. After each set of 10 deep breaths, practice coughing to be sure your lungs are clear. If you have an incision (the cut made at the time of surgery), support your incision when coughing by placing a pillow or rolled up towels firmly against it. Once you are able to get out of bed, walk around indoors and cough well. You may stop using the incentive spirometer when instructed by your caregiver.  RISKS AND COMPLICATIONS Take your time so you do not get dizzy or light-headed. If you are in pain, you may need to take or ask for pain medication before doing incentive spirometry. It is harder to take a deep breath if you are having pain. AFTER USE Rest and breathe slowly and easily. It can be helpful to keep track of a log of your progress. Your caregiver can provide you with a simple  table to help with this. If you are using the spirometer at home, follow these instructions: SEEK MEDICAL CARE IF:  You are having difficultly using the spirometer. You have trouble using the spirometer as often as instructed. Your pain medication is not giving enough relief while using the spirometer. You develop fever of 100.5 F (38.1 C) or higher.  SEEK IMMEDIATE MEDICAL CARE IF:  You cough up bloody sputum that had not been present before. You develop fever of 102 F (38.9 C) or greater. You develop worsening pain at or near the incision site. MAKE SURE YOU:  Understand these instructions. Will watch your condition. Will get help right away if you are not doing well or get worse. Document Released: 09/17/2006 Document Revised: 07/30/2011 Document Reviewed: 11/18/2006 [facility] Patient Information 2014 [place], [place].         WHAT IS A BLOOD TRANSFUSION? Blood Transfusion Information  A transfusion is the replacement of blood or some of its parts. Blood is made up of multiple cells which provide different functions. Red blood cells carry oxygen and are used for blood loss replacement. White blood cells fight against infection. Platelets control bleeding. Plasma helps clot blood. Other blood products are available for specialized needs, such as hemophilia or other clotting disorders. BEFORE THE TRANSFUSION  Who gives blood for transfusions?  Healthy volunteers who are fully evaluated to make sure their blood is safe. This is blood bank blood. Transfusion therapy is the safest it has ever been in the practice of medicine. Before blood is taken from a donor, a complete history is taken to make sure that person has no history of diseases nor engages in risky social behavior (examples are intravenous drug use or sexual activity with multiple partners). The donor's travel history is  screened to minimize risk of transmitting infections, such as malaria. The donated blood is tested for signs of infectious diseases, such as HIV and hepatitis. The blood is then tested to be sure it is compatible with you in order to minimize the chance of a transfusion reaction. If you or a relative donates blood, this is often done in anticipation of surgery and is not appropriate for emergency situations. It takes many days to process the donated blood. RISKS AND COMPLICATIONS Although transfusion therapy is very safe and saves many lives, the main dangers of transfusion include:  Getting an infectious disease. Developing a transfusion reaction. This is an allergic reaction to something in the blood you were given. Every precaution is taken to prevent this. The decision to have a blood transfusion has been considered carefully by your caregiver before blood is given. Blood is not given unless the benefits outweigh the risks. AFTER THE TRANSFUSION Right after receiving a blood transfusion, you will usually feel much better and more energetic. This is especially true if your red blood cells have gotten low (anemic). The transfusion raises the level of the red blood cells which carry oxygen, and this usually causes an energy increase. The nurse administering the transfusion will monitor you carefully for complications. HOME CARE INSTRUCTIONS  No special instructions are needed after a transfusion. You may find your energy is better. Speak with your caregiver about any limitations on activity for underlying diseases you may have. SEEK MEDICAL CARE IF:  Your condition is not improving after your transfusion. You develop redness or irritation at the intravenous (IV) site. SEEK IMMEDIATE MEDICAL CARE IF:  Any of the following symptoms occur over the next 12 hours: Shaking chills. You have a temperature by mouth above 102 F (38.9 C), not controlled by medicine. Chest, back, or muscle pain. People  around you feel you are not acting correctly or are confused. Shortness of breath or difficulty breathing. Dizziness and fainting. You get a rash or develop hives. You have a decrease in urine output. Your urine turns a dark color or  changes to pink, red, or brown. Any of the following symptoms occur over the next 10 days: You have a temperature by mouth above 102 F (38.9 C), not controlled by medicine. Shortness of breath. Weakness after normal activity. The white part of the eye turns yellow (jaundice). You have a decrease in the amount of urine or are urinating less often. Your urine turns a dark color or changes to pink, red, or brown. Document Released: 05/04/2000 Document Revised: 07/30/2011 Document Reviewed: 12/22/2007 [facility] Patient Information 2014 [place], [place].  _______________________________________________________________________          If you would like to see a video about joint replacement:   [email or web address]

## 2024-05-05 NOTE — Progress Notes (Signed)
 COVID Vaccine received:  []  No [x]  Yes Date of any COVID positive Test in last 90 days:  PCP - Gaither Langton, MD  [phone] (Work)  Cardiologist -   Chest x-ray -  EKG -   Stress Test -  ECHO -  Cardiac Cath -  CT Coronary Calcium score: 51.6  on 08-09-2022  Epic  Pacemaker / ICD device []  No []  Yes   Spinal Cord Stimulator:[]  No []  Yes       History of Sleep Apnea? []  No []  Yes   CPAP used?- []  No []  Yes    Medication on DOS: Tylenol  Patient has: []  NO Hx DM   []  Pre-DM   []  DM1  []   DM2 Does the patient monitor blood sugar?   []  N/A   []  No []  Yes  Last A1c was:        on      Does patient have a [employer] or Dexcom? []  No []  Yes   Fasting Blood Sugar Ranges-  Checks Blood Sugar _____ times a day  Blood Thinner / Instructions: Aspirin Instructions:  Activity level: Able to walk up 2 flights of stairs without becoming significantly short of breath or having chest pain?   []    Yes   []  No,  would have:  Patient can perform ADLs without assistance.  []   Yes    []  No  Comments:   Anesthesia review: HTN no meds, IBS-D  Patient denies any S&S of respiratory illness or Covid - no shortness of breath, fever, cough or chest pain at PAT appointment.  Patient verbalized understanding and agreement to the Pre-Surgical Instructions that were given to them at this PAT appointment. Patient was also educated of the need to review these PAT instructions again prior to his/her surgery.I reviewed the appropriate phone numbers to call if they have any and questions or concerns.

## 2024-05-06 ENCOUNTER — Encounter (HOSPITAL_COMMUNITY): Payer: Self-pay

## 2024-05-06 ENCOUNTER — Other Ambulatory Visit: Payer: Self-pay

## 2024-05-06 ENCOUNTER — Encounter (HOSPITAL_COMMUNITY): Admission: RE | Admit: 2024-05-06 | Discharge: 2024-05-06 | Attending: Orthopedic Surgery

## 2024-05-06 VITALS — BP 152/60 | HR 58 | Temp 98.1°F | Resp 16 | Ht 63.0 in | Wt 148.0 lb

## 2024-05-06 DIAGNOSIS — K580 Irritable bowel syndrome with diarrhea: Secondary | ICD-10-CM | POA: Insufficient documentation

## 2024-05-06 DIAGNOSIS — M1612 Unilateral primary osteoarthritis, left hip: Secondary | ICD-10-CM | POA: Diagnosis not present

## 2024-05-06 DIAGNOSIS — Z01818 Encounter for other preprocedural examination: Secondary | ICD-10-CM | POA: Diagnosis present

## 2024-05-06 DIAGNOSIS — I10 Essential (primary) hypertension: Secondary | ICD-10-CM | POA: Diagnosis not present

## 2024-05-06 HISTORY — DX: Essential (primary) hypertension: I10

## 2024-05-06 HISTORY — DX: Unspecified osteoarthritis, unspecified site: M19.90

## 2024-05-06 LAB — CBC
HCT: 42 % (ref 36.0–46.0)
Hemoglobin: 13.6 g/dL (ref 12.0–15.0)
MCH: 28.9 pg (ref 26.0–34.0)
MCHC: 32.4 g/dL (ref 30.0–36.0)
MCV: 89.4 fL (ref 80.0–100.0)
Platelets: 252 K/uL (ref 150–400)
RBC: 4.7 MIL/uL (ref 3.87–5.11)
RDW: 13.7 % (ref 11.5–15.5)
WBC: 8.9 K/uL (ref 4.0–10.5)
nRBC: 0 % (ref 0.0–0.2)

## 2024-05-06 LAB — BASIC METABOLIC PANEL WITH GFR
Anion gap: 10 (ref 5–15)
BUN: 21 mg/dL (ref 8–23)
CO2: 25 mmol/L (ref 22–32)
Calcium: 9.4 mg/dL (ref 8.9–10.3)
Chloride: 106 mmol/L (ref 98–111)
Creatinine, Ser: 1.03 mg/dL — ABNORMAL HIGH (ref 0.44–1.00)
GFR, Estimated: 55 mL/min — ABNORMAL LOW (ref >60)
Glucose, Bld: 95 mg/dL (ref 70–99)
Potassium: 4.8 mmol/L (ref 3.5–5.1)
Sodium: 141 mmol/L (ref 135–145)

## 2024-05-06 LAB — TYPE AND SCREEN
ABO/RH(D): A POS
Antibody Screen: NEGATIVE

## 2024-05-06 LAB — SURGICAL PCR SCREEN
MRSA, PCR: NEGATIVE
Staphylococcus aureus: NEGATIVE

## 2024-05-17 NOTE — H&P (Signed)
 TOTAL HIP ADMISSION H&P  Patient is admitted for left total hip arthroplasty.  Therapy Plans: HEP Disposition: Home with husband Planned DVT Prophylaxis: aspirin  81mg  BID DME needed: none PCP: Dr. Sheryle - clearance received TXA: IV Allergies: NKDA Anesthesia Concerns: none BMI: 26.5 Last HgbA1c: Not diabetic   Other: - staying overnight - No hx of VTE or cancer - tramadol  / oxycodone, robaxin , tylenol , celebrex   Subjective:  Chief Complaint: left hip pain  HPI: Jessica Dominguez, 78 y.o. female, has a history of pain and functional disability in the left hip(s) due to arthritis and patient has failed non-surgical conservative treatments for greater than 12 weeks to include NSAID's and/or analgesics and activity modification.  Onset of symptoms was gradual starting 2 years ago with gradually worsening course since that time.The patient noted no past surgery on the left hip(s).  Patient currently rates pain in the left hip at 8 out of 10 with activity. Patient has worsening of pain with activity and weight bearing, trendelenberg gait, and pain that interfers with activities of daily living. Patient has evidence of joint space narrowing by imaging studies. This condition presents safety issues increasing the risk of falls.   There is no current active infection.  Patient Active Problem List   Diagnosis Date Noted   Irritable bowel syndrome with diarrhea 02/22/2022   Diarrhea 08/22/2021   Encounter for colonoscopy due to history of adenomatous colonic polyps 08/22/2021   Past Medical History:  Diagnosis Date   Arthritis    Hypercholesteremia    Hypertension    Seasonal allergies     Past Surgical History:  Procedure Laterality Date   BIOPSY  09/20/2021   Procedure: BIOPSY;  Surgeon: Eartha Angelia Sieving, MD;  [facility];  Service: Gastroenterology;;   COLONOSCOPY N/A 09/25/2012   Procedure: COLONOSCOPY;  Surgeon: Claudis RAYMOND Rivet, MD;  [facility];  Service: Endoscopy;  Laterality: N/A;  830   COLONOSCOPY WITH PROPOFOL  N/A 09/20/2021   Procedure: COLONOSCOPY WITH PROPOFOL ;  Surgeon: Eartha Angelia Sieving, MD;  Location: [facility];  Service: Gastroenterology;  Laterality: N/A;  1020   EYE SURGERY Bilateral    Lasik and cataract extraction   POLYPECTOMY  09/20/2021   Procedure: POLYPECTOMY;  Surgeon: Eartha Angelia Sieving, MD;  Location: [facility];  Service: Gastroenterology;;    No current facility-administered medications for this encounter.   Current Outpatient Medications  Medication Sig Dispense Refill Last Dose/Taking   acetaminophen  (TYLENOL ) 500 MG tablet Take 1,000 mg by mouth every 6 (six) hours as needed for moderate pain.   Taking As Needed   celecoxib (CELEBREX) 200 MG capsule Take 200 mg by mouth at bedtime.   Taking   dicyclomine  (BENTYL ) 10 MG capsule Take 1 capsule (10 mg total) by mouth 3 (three) times daily as needed for spasms. (Patient taking differently: Take 10 mg by mouth daily as needed for spasms. Can take up to 3 times daily) 90 capsule 1 Taking Differently   Dihydroxyaluminum Sod Carb (ROLAIDS PO) Take 1 tablet by mouth daily as needed (heartburn).   Taking As Needed   loperamide (IMODIUM A-D) 2 MG tablet Take 2 mg by mouth 4 (four) times daily as needed for diarrhea or loose stools.   Taking As Needed   Magnesium 250 MG TABS Take 1 tablet by mouth every evening.   Taking   OVER THE COUNTER MEDICATION Take 1 capsule by mouth in the morning and at bedtime. Bio Complete 3  Taking   rosuvastatin (CRESTOR) 20 MG tablet Take 20 mg by mouth at bedtime.   Taking   diphenhydramine-acetaminophen  (TYLENOL  PM) 25-500 MG TABS tablet Take 1 tablet by mouth at bedtime as needed (sleep). (Patient not taking: Reported on 05/04/2024)   Not Taking   Allergies[1]  Social History   Tobacco Use   Smoking status: Never   Smokeless tobacco: Not on file  Substance Use Topics   Alcohol use: Yes    Comment:  occasionally    Family History  Problem Relation Age of Onset   Colon cancer Neg Hx      Review of Systems  Constitutional:  Negative for chills and fever.  Respiratory:  Negative for cough and shortness of breath.   Cardiovascular:  Negative for chest pain.  Gastrointestinal:  Negative for nausea and vomiting.  Musculoskeletal:  Positive for arthralgias.     Objective:  Physical Exam Left hip exam: She does have pain and limited hip flexion internal rotation to 5 to 10 degrees with external rotation over 20 degrees She has mild tenderness in the lateral and posterior aspect the hip Mild external rotation contracture with active hip flexion with maintenance of full strength No lower extremity edema, erythema or calf tenderness  Vital signs in last 24 hours:    Labs:   Estimated body mass index is 26.22 kg/m as calculated from the following:   Height as of 05/06/24: 5' 3 (1.6 m).   Weight as of 05/06/24: 67.1 kg.   Imaging Review Plain radiographs demonstrate severe degenerative joint disease of the left hip(s). The bone quality appears to be adequate for age and reported activity level.      Assessment/Plan:  End stage arthritis, left hip(s)  The patient history, physical examination, clinical judgement of the provider and imaging studies are consistent with end stage degenerative joint disease of the left hip(s) and total hip arthroplasty is deemed medically necessary. The treatment options including medical management, injection therapy, arthroscopy and arthroplasty were discussed at length. The risks and benefits of total hip arthroplasty were presented and reviewed. The risks due to aseptic loosening, infection, stiffness, dislocation/subluxation,  thromboembolic complications and other imponderables were discussed.  The patient acknowledged the explanation, agreed to proceed with the plan and consent was signed. Patient is being admitted for inpatient treatment  for surgery, pain control, PT, OT, prophylactic antibiotics, VTE prophylaxis, progressive ambulation and ADL's and discharge planning.The patient is planning to be discharged home.   Jessica Calin, PA-C Orthopedic Surgery [facility] [place] [phone]      [1] No Known Allergies

## 2024-05-19 ENCOUNTER — Ambulatory Visit (HOSPITAL_COMMUNITY)

## 2024-05-19 ENCOUNTER — Observation Stay (HOSPITAL_COMMUNITY)

## 2024-05-19 ENCOUNTER — Ambulatory Visit (HOSPITAL_COMMUNITY): Admitting: Anesthesiology

## 2024-05-19 ENCOUNTER — Observation Stay (HOSPITAL_COMMUNITY)
Admission: RE | Admit: 2024-05-19 | Discharge: 2024-05-20 | Disposition: A | Discharge status: Home / Self Care | Source: Ambulatory Visit | Attending: Orthopedic Surgery | Admitting: Orthopedic Surgery

## 2024-05-19 ENCOUNTER — Encounter (HOSPITAL_COMMUNITY): Payer: Self-pay | Admitting: Orthopedic Surgery

## 2024-05-19 ENCOUNTER — Ambulatory Visit (HOSPITAL_COMMUNITY): Payer: Self-pay | Admitting: Medical

## 2024-05-19 ENCOUNTER — Other Ambulatory Visit: Payer: Self-pay

## 2024-05-19 ENCOUNTER — Encounter (HOSPITAL_COMMUNITY)
Admission: RE | Disposition: A | Discharge status: Home / Self Care | Payer: Self-pay | Source: Ambulatory Visit | Attending: Orthopedic Surgery

## 2024-05-19 DIAGNOSIS — M1612 Unilateral primary osteoarthritis, left hip: Principal | ICD-10-CM | POA: Insufficient documentation

## 2024-05-19 DIAGNOSIS — I10 Essential (primary) hypertension: Secondary | ICD-10-CM | POA: Diagnosis not present

## 2024-05-19 DIAGNOSIS — M25552 Pain in left hip: Secondary | ICD-10-CM | POA: Diagnosis present

## 2024-05-19 DIAGNOSIS — Z96642 Presence of left artificial hip joint: Secondary | ICD-10-CM

## 2024-05-19 DIAGNOSIS — Z79899 Other long term (current) drug therapy: Secondary | ICD-10-CM | POA: Diagnosis not present

## 2024-05-19 HISTORY — PX: TOTAL HIP ARTHROPLASTY: SHX124

## 2024-05-19 LAB — ABO/RH: ABO/RH(D): A POS

## 2024-05-19 MED ORDER — ALUM & MAG HYDROXIDE-SIMETH 200-200-20 MG/5ML PO SUSP: 30.0000 mL | ORAL | Status: DC | PRN

## 2024-05-19 MED ORDER — PROPOFOL 500 MG/50ML IV EMUL
INTRAVENOUS | Status: DC | PRN
  Administered 2024-05-19: 100 ug/kg/min via INTRAVENOUS

## 2024-05-19 MED ORDER — ACETAMINOPHEN 500 MG PO TABS
1000.0000 mg | ORAL_TABLET | Freq: Four times a day (QID) | ORAL | Status: DC
  Administered 2024-05-19 – 2024-05-20 (×4): 1000 mg via ORAL
  Filled 2024-05-19 (×4): qty 2

## 2024-05-19 MED ORDER — ONDANSETRON HCL 4 MG/2ML IJ SOLN: 4.0000 mg | Freq: Once | INTRAMUSCULAR | Status: DC | PRN

## 2024-05-19 MED ORDER — PHENYLEPHRINE HCL-NACL 20-0.9 MG/250ML-% IV SOLN
INTRAVENOUS | Status: DC | PRN
  Administered 2024-05-19: 40 ug/min via INTRAVENOUS

## 2024-05-19 MED ORDER — DIPHENHYDRAMINE HCL 12.5 MG/5ML PO ELIX: 12.5000 mg | ORAL_SOLUTION | ORAL | Status: DC | PRN

## 2024-05-19 MED ORDER — ROSUVASTATIN CALCIUM 20 MG PO TABS
20.0000 mg | ORAL_TABLET | Freq: Every day | ORAL | Status: DC
  Administered 2024-05-20: 20 mg via ORAL
  Filled 2024-05-19: qty 1

## 2024-05-19 MED ORDER — POLYETHYLENE GLYCOL 3350 17 G PO PACK
17.0000 g | PACK | Freq: Two times a day (BID) | ORAL | Status: DC
  Filled 2024-05-19 (×2): qty 1

## 2024-05-19 MED ORDER — METOCLOPRAMIDE HCL 5 MG/ML IJ SOLN: 5.0000 mg | Freq: Three times a day (TID) | INTRAMUSCULAR | Status: DC | PRN

## 2024-05-19 MED ORDER — PROPOFOL 10 MG/ML IV BOLUS
INTRAVENOUS | Status: DC | PRN
  Administered 2024-05-19: 30 mg via INTRAVENOUS
  Administered 2024-05-19 (×2): 20 mg via INTRAVENOUS

## 2024-05-19 MED ORDER — SODIUM CHLORIDE (PF) 0.9 % IJ SOLN
INTRAMUSCULAR | Status: AC
  Filled 2024-05-19: qty 30

## 2024-05-19 MED ORDER — ORAL CARE MOUTH RINSE: 15.0000 mL | Freq: Once | OROMUCOSAL | Status: AC

## 2024-05-19 MED ORDER — FENTANYL CITRATE (PF) 100 MCG/2ML IJ SOLN
INTRAMUSCULAR | Status: DC | PRN
  Administered 2024-05-19: 50 ug via INTRAVENOUS

## 2024-05-19 MED ORDER — KETOROLAC TROMETHAMINE 30 MG/ML IJ SOLN
INTRAMUSCULAR | Status: AC
  Filled 2024-05-19: qty 1

## 2024-05-19 MED ORDER — SENNA 8.6 MG PO TABS
2.0000 | ORAL_TABLET | Freq: Every day | ORAL | Status: DC
  Filled 2024-05-19: qty 2

## 2024-05-19 MED ORDER — ACETAMINOPHEN 325 MG PO TABS: 325.0000 mg | ORAL_TABLET | Freq: Four times a day (QID) | ORAL | Status: DC | PRN

## 2024-05-19 MED ORDER — DICYCLOMINE HCL 10 MG PO CAPS: 10.0000 mg | ORAL_CAPSULE | Freq: Every day | ORAL | Status: DC | PRN

## 2024-05-19 MED ORDER — GLYCOPYRROLATE 0.2 MG/ML IJ SOLN
INTRAMUSCULAR | Status: AC
  Filled 2024-05-19: qty 1

## 2024-05-19 MED ORDER — GLYCOPYRROLATE 0.2 MG/ML IJ SOLN
INTRAMUSCULAR | Status: DC | PRN
  Administered 2024-05-19: .2 mg via INTRAVENOUS

## 2024-05-19 MED ORDER — CEFAZOLIN SODIUM-DEXTROSE 2-4 GM/100ML-% IV SOLN
2.0000 g | Freq: Four times a day (QID) | INTRAVENOUS | Status: AC
  Administered 2024-05-19 – 2024-05-20 (×2): 2 g via INTRAVENOUS
  Filled 2024-05-19 (×2): qty 100

## 2024-05-19 MED ORDER — STERILE WATER FOR IRRIGATION IR SOLN
Status: DC | PRN
  Administered 2024-05-19: 2000 mL

## 2024-05-19 MED ORDER — OXYCODONE HCL 5 MG PO TABS
5.0000 mg | ORAL_TABLET | ORAL | Status: DC | PRN
  Filled 2024-05-19: qty 2

## 2024-05-19 MED ORDER — MENTHOL 3 MG MT LOZG: 1.0000 | LOZENGE | OROMUCOSAL | Status: DC | PRN

## 2024-05-19 MED ORDER — FENTANYL CITRATE (PF) 50 MCG/ML IJ SOSY: 25.0000 ug | PREFILLED_SYRINGE | INTRAMUSCULAR | Status: DC | PRN

## 2024-05-19 MED ORDER — METOCLOPRAMIDE HCL 5 MG PO TABS: 5.0000 mg | ORAL_TABLET | Freq: Three times a day (TID) | ORAL | Status: DC | PRN

## 2024-05-19 MED ORDER — OXYCODONE HCL 5 MG/5ML PO SOLN: 5.0000 mg | Freq: Once | ORAL | Status: DC | PRN

## 2024-05-19 MED ORDER — DEXAMETHASONE SOD PHOSPHATE PF 10 MG/ML IJ SOLN
10.0000 mg | Freq: Once | INTRAMUSCULAR | Status: AC
  Administered 2024-05-20: 10 mg via INTRAVENOUS

## 2024-05-19 MED ORDER — ONDANSETRON HCL 4 MG/2ML IJ SOLN: 4.0000 mg | Freq: Four times a day (QID) | INTRAMUSCULAR | Status: DC | PRN

## 2024-05-19 MED ORDER — ONDANSETRON HCL 4 MG/2ML IJ SOLN
INTRAMUSCULAR | Status: AC
  Filled 2024-05-19: qty 2

## 2024-05-19 MED ORDER — TRAMADOL HCL 50 MG PO TABS
50.0000 mg | ORAL_TABLET | Freq: Four times a day (QID) | ORAL | Status: DC | PRN
  Administered 2024-05-19 – 2024-05-20 (×2): 100 mg via ORAL
  Filled 2024-05-19 (×2): qty 2

## 2024-05-19 MED ORDER — METHOCARBAMOL 500 MG PO TABS
500.0000 mg | ORAL_TABLET | Freq: Four times a day (QID) | ORAL | Status: DC | PRN
  Administered 2024-05-19 – 2024-05-20 (×2): 500 mg via ORAL
  Filled 2024-05-19 (×2): qty 1

## 2024-05-19 MED ORDER — MEPIVACAINE HCL (PF) 2 % IJ SOLN
INTRAMUSCULAR | Status: DC | PRN
  Administered 2024-05-19: 3 mL via EPIDURAL

## 2024-05-19 MED ORDER — CEFAZOLIN SODIUM-DEXTROSE 2-4 GM/100ML-% IV SOLN
2.0000 g | INTRAVENOUS | Status: AC
  Administered 2024-05-19: 2 g via INTRAVENOUS
  Filled 2024-05-19: qty 100

## 2024-05-19 MED ORDER — DEXAMETHASONE SOD PHOSPHATE PF 10 MG/ML IJ SOLN
8.0000 mg | Freq: Once | INTRAMUSCULAR | Status: AC
  Administered 2024-05-19: 5 mg via INTRAVENOUS

## 2024-05-19 MED ORDER — LACTATED RINGERS IV SOLN: INTRAVENOUS | Status: DC | PRN

## 2024-05-19 MED ORDER — PHENOL 1.4 % MT LIQD: 1.0000 | OROMUCOSAL | Status: DC | PRN

## 2024-05-19 MED ORDER — SODIUM CHLORIDE (PF) 0.9 % IJ SOLN
INTRAMUSCULAR | Status: DC | PRN
  Administered 2024-05-19: 61 mL

## 2024-05-19 MED ORDER — ACETAMINOPHEN 10 MG/ML IV SOLN: 1000.0000 mg | Freq: Once | INTRAVENOUS | Status: DC | PRN

## 2024-05-19 MED ORDER — METHOCARBAMOL 1000 MG/10ML IJ SOLN: 500.0000 mg | Freq: Four times a day (QID) | INTRAMUSCULAR | Status: DC | PRN

## 2024-05-19 MED ORDER — ASPIRIN 81 MG PO CHEW
81.0000 mg | CHEWABLE_TABLET | Freq: Two times a day (BID) | ORAL | Status: DC
  Administered 2024-05-19 – 2024-05-20 (×2): 81 mg via ORAL
  Filled 2024-05-19 (×2): qty 1

## 2024-05-19 MED ORDER — ONDANSETRON HCL 4 MG/2ML IJ SOLN
INTRAMUSCULAR | Status: DC | PRN
  Administered 2024-05-19: 4 mg via INTRAVENOUS

## 2024-05-19 MED ORDER — POVIDONE-IODINE 10 % EX SWAB: 2.0000 | Freq: Once | CUTANEOUS | Status: DC

## 2024-05-19 MED ORDER — ORAL CARE MOUTH RINSE: 15.0000 mL | OROMUCOSAL | Status: DC | PRN

## 2024-05-19 MED ORDER — TRANEXAMIC ACID-NACL 1000-0.7 MG/100ML-% IV SOLN
1000.0000 mg | INTRAVENOUS | Status: AC
  Administered 2024-05-19: 1000 mg via INTRAVENOUS
  Filled 2024-05-19: qty 100

## 2024-05-19 MED ORDER — CHLORHEXIDINE GLUCONATE 0.12 % MT SOLN
15.0000 mL | Freq: Once | OROMUCOSAL | Status: AC
  Administered 2024-05-19: 15 mL via OROMUCOSAL

## 2024-05-19 MED ORDER — PROPOFOL 1000 MG/100ML IV EMUL
INTRAVENOUS | Status: AC
  Filled 2024-05-19: qty 100

## 2024-05-19 MED ORDER — SODIUM CHLORIDE 0.9 % IV SOLN: INTRAVENOUS | Status: DC

## 2024-05-19 MED ORDER — BISACODYL 10 MG RE SUPP: 10.0000 mg | Freq: Every day | RECTAL | Status: DC | PRN

## 2024-05-19 MED ORDER — ONDANSETRON HCL 4 MG PO TABS: 4.0000 mg | ORAL_TABLET | Freq: Four times a day (QID) | ORAL | Status: DC | PRN

## 2024-05-19 MED ORDER — LACTATED RINGERS IV SOLN: INTRAVENOUS | Status: DC

## 2024-05-19 MED ORDER — OXYCODONE HCL 5 MG PO TABS: 5.0000 mg | ORAL_TABLET | Freq: Once | ORAL | Status: DC | PRN

## 2024-05-19 MED ORDER — FENTANYL CITRATE (PF) 100 MCG/2ML IJ SOLN
INTRAMUSCULAR | Status: AC
  Filled 2024-05-19: qty 2

## 2024-05-19 MED ORDER — HYDROMORPHONE HCL 1 MG/ML IJ SOLN: 0.5000 mg | INTRAMUSCULAR | Status: DC | PRN

## 2024-05-19 MED ORDER — 0.9 % SODIUM CHLORIDE (POUR BTL) OPTIME
TOPICAL | Status: DC | PRN
  Administered 2024-05-19: 1000 mL

## 2024-05-19 MED ORDER — TRANEXAMIC ACID-NACL 1000-0.7 MG/100ML-% IV SOLN
1000.0000 mg | Freq: Once | INTRAVENOUS | Status: AC
  Administered 2024-05-19: 1000 mg via INTRAVENOUS
  Filled 2024-05-19: qty 100

## 2024-05-19 MED ORDER — BUPIVACAINE-EPINEPHRINE (PF) 0.25% -1:200000 IJ SOLN
INTRAMUSCULAR | Status: AC
  Filled 2024-05-19: qty 30

## 2024-05-19 NOTE — Anesthesia Procedure Notes (Signed)
 Procedure Name: MAC Date/Time: 05/19/2024 11:01 AM  Performed by: Franchot Delon RAMAN, CRNAPre-anesthesia Checklist: Patient identified, Emergency Drugs available, Suction available and Patient being monitored Oxygen Delivery Method: Simple face mask Ventilation: Oral airway inserted - appropriate to patient size Placement Confirmation: positive ETCO2 Dental Injury: Teeth and Oropharynx as per pre-operative assessment

## 2024-05-19 NOTE — Evaluation (Signed)
 Physical Therapy Evaluation Patient Details Name: Jessica Dominguez MRN: [id number] DOB: 04/21/1946 Today's Date: 05/19/2024  History of Present Illness  Pt is 78 yo female admitted on 05/19/24 for L anterior THA.  Pt with hx including but not limited to arthritis, HCL, HTN  Clinical Impression  Pt is s/p THA resulting in the deficits listed below (see PT Problem List). At baseline, pt independent and ambulatory without AD.  She has family support and RW. Today pt reports minimal pain and was able to transfer with min A and ambulated 60' with RW.  Noting plan for HEP only at d/c. Pt expected to progress well.  Pt will benefit from acute skilled PT to increase their independence and safety with mobility to facilitate discharge.          If plan is discharge home, recommend the following: A little help with walking and/or transfers;A little help with bathing/dressing/bathroom;Assistance with cooking/housework;Help with stairs or ramp for entrance   Can travel by private vehicle        Equipment Recommendations None recommended by PT  Recommendations for Other Services       Functional Status Assessment Patient has had a recent decline in their functional status and demonstrates the ability to make significant improvements in function in a reasonable and predictable amount of time.     Precautions / Restrictions Precautions Precautions: Fall Restrictions Weight Bearing Restrictions Per Provider Order: Yes LLE Weight Bearing Per Provider Order: Weight bearing as tolerated      Mobility  Bed Mobility Overal bed mobility: Needs Assistance Bed Mobility: Supine to Sit, Sit to Supine     Supine to sit: Min assist, Used rails, HOB elevated Sit to supine: Min assist, Used rails, HOB elevated        Transfers Overall transfer level: Needs assistance Equipment used: Rolling walker (2 wheels) Transfers: Sit to/from Stand Sit to Stand: Contact guard assist           General  transfer comment: educated for hand placement    Ambulation/Gait Ambulation/Gait assistance: Contact guard assist Gait Distance (Feet): 60 Feet Assistive device: Rolling walker (2 wheels) Gait Pattern/deviations: Step-through pattern, Decreased stride length Gait velocity: decreased but functional     General Gait Details: min cues for RW proximity  Stairs            Wheelchair Mobility     Tilt Bed    Modified Rankin (Stroke Patients Only)       Balance Overall balance assessment: Needs assistance Sitting-balance support: No upper extremity supported Sitting balance-Leahy Scale: Good     Standing balance support: Bilateral upper extremity supported, Reliant on assistive device for balance Standing balance-Leahy Scale: Poor Standing balance comment: steady with RW                             Pertinent Vitals/Pain Pain Assessment Pain Assessment: 0-10 Pain Score: 2  Pain Location: L hip Pain Descriptors / Indicators: Discomfort Pain Intervention(s): Limited activity within patient's tolerance, Monitored during session, Premedicated before session, Repositioned    Home Living Family/patient expects to be discharged to:: Private residence Living Arrangements: Spouse/significant other Available Help at Discharge: Family;Available 24 hours/day Type of Home: House Home Access: Stairs to enter Entrance Stairs-Rails: None Entrance Stairs-Number of Steps: 2   Home Layout: One level Home Equipment: Pharmacist, Hospital (2 wheels);Cane - single point      Prior Function Prior Level of Function : Independent/Modified  Independent;Driving             Mobility Comments: Could ambulate in community ADLs Comments: independent adls and iadls     Extremity/Trunk Assessment   Upper Extremity Assessment Upper Extremity Assessment: Overall WFL for tasks assessed    Lower Extremity Assessment Lower Extremity Assessment: LLE  deficits/detail;RLE deficits/detail RLE Deficits / Details: ROM WFL; MMT 5/5 RLE Sensation: WNL LLE Deficits / Details: Expected post op changes; ROM WFL; MMT: ankle 5/5, knee 3/5 not further tested, hip 1/5 LLE Sensation: WNL    Cervical / Trunk Assessment Cervical / Trunk Assessment: Normal  Communication        Cognition Arousal: Alert Behavior During Therapy: Flat affect   PT - Cognitive impairments: No apparent impairments                                 Cueing       General Comments General comments (skin integrity, edema, etc.): encouraged ankle pumps    Exercises     Assessment/Plan    PT Assessment Patient needs continued PT services  PT Problem List Decreased strength;Decreased range of motion;Decreased activity tolerance;Decreased balance;Decreased mobility;Decreased knowledge of use of DME;Pain       PT Treatment Interventions DME instruction;Therapeutic exercise;Gait training;Stair training;Functional mobility training;Therapeutic activities;Patient/family education;Modalities;Balance training    PT Goals (Current goals can be found in the Care Plan section)  Acute Rehab PT Goals Patient Stated Goal: return home PT Goal Formulation: With patient/family Time For Goal Achievement: 06/02/24 Potential to Achieve Goals: Good    Frequency 7X/week     Co-evaluation               AM-PAC PT 6 Clicks Mobility  Outcome Measure Help needed turning from your back to your side while in a flat bed without using bedrails?: A Little Help needed moving from lying on your back to sitting on the side of a flat bed without using bedrails?: A Little Help needed moving to and from a bed to a chair (including a wheelchair)?: A Little Help needed standing up from a chair using your arms (e.g., wheelchair or bedside chair)?: A Little Help needed to walk in hospital room?: A Little Help needed climbing 3-5 steps with a railing? : A Little 6 Click  Score: 18    End of Session Equipment Utilized During Treatment: Gait belt Activity Tolerance: Patient tolerated treatment well Patient left: in bed;with call bell/phone within reach;with bed alarm set;with SCD's reapplied Nurse Communication: Mobility status PT Visit Diagnosis: Other abnormalities of gait and mobility (R26.89);Muscle weakness (generalized) (M62.81)    Time: 8295-8277 PT Time Calculation (min) (ACUTE ONLY): 18 min   Charges:   PT Evaluation $PT Eval Low Complexity: 1 Low   PT General Charges $$ ACUTE PT VISIT: 1 Visit         Benjiman, PT Acute Rehab [facility] [phone]   Benjiman VEAR Mulberry 05/19/2024, 5:53 PM

## 2024-05-19 NOTE — Anesthesia Preprocedure Evaluation (Signed)
"                                    Anesthesia Evaluation  Patient identified by MRN, date of birth, ID band Patient awake    Reviewed: Allergy & Precautions, NPO status , Patient's Chart, lab work & pertinent test results, reviewed documented beta blocker date and time   History of Anesthesia Complications Negative for: history of anesthetic complications  Airway Mallampati: III  TM Distance: >3 FB   Mouth opening: Limited Mouth Opening  Dental no notable dental hx.    Pulmonary neg COPD   breath sounds clear to auscultation       Cardiovascular hypertension, (-) angina (-) CAD, (-) Past MI and (-) Cardiac Stents  Rhythm:Regular Rate:Normal     Neuro/Psych neg Seizures    GI/Hepatic ,neg GERD  ,,(+) neg Cirrhosis        Endo/Other    Renal/GU Renal disease     Musculoskeletal  (+) Arthritis , Osteoarthritis,    Abdominal   Peds  Hematology   Anesthesia Other Findings   Reproductive/Obstetrics                              Anesthesia Physical Anesthesia Plan  ASA: 2  Anesthesia Plan: Spinal and MAC   Post-op Pain Management:    Induction: Intravenous  PONV Risk Score and Plan: 2 and Ondansetron   Airway Management Planned: Natural Airway and Simple Face Mask  Additional Equipment:   Intra-op Plan:   Post-operative Plan:   Informed Consent: I have reviewed the patients History and Physical, chart, labs and discussed the procedure including the risks, benefits and alternatives for the proposed anesthesia with the patient or authorized representative who has indicated his/her understanding and acceptance.     Dental advisory given  Plan Discussed with: CRNA  Anesthesia Plan Comments:          Anesthesia Quick Evaluation  "

## 2024-05-19 NOTE — Anesthesia Postprocedure Evaluation (Signed)
"   Anesthesia Post Note  Patient: Jessica Dominguez  Procedure(s) Performed: ARTHROPLASTY, HIP, TOTAL, ANTERIOR APPROACH (Left: Hip)     Patient location during evaluation: PACU Anesthesia Type: MAC and Spinal Level of consciousness: awake and alert Pain management: pain level controlled Vital Signs Assessment: post-procedure vital signs reviewed and stable Respiratory status: spontaneous breathing, nonlabored ventilation, respiratory function stable and patient connected to nasal cannula oxygen Cardiovascular status: blood pressure returned to baseline and stable Postop Assessment: no apparent nausea or vomiting Anesthetic complications: no   No notable events documented.  Last Vitals:  Vitals:   05/19/24 1345 05/19/24 1415  BP: (!) 138/51 131/69  Pulse: (!) 53 (!) 58  Resp: 17 19  Temp:    SpO2: 100% 100%    Last Pain:  Vitals:   05/19/24 1415  TempSrc:   PainSc: 0-No pain    LLE Motor Response: Purposeful movement (05/19/24 1415) LLE Sensation: Decreased;Numbness (05/19/24 1415) RLE Motor Response: Purposeful movement (05/19/24 1415) RLE Sensation: Decreased;Numbness (05/19/24 1415) L Sensory Level: L3-Anterior knee, lower leg (05/19/24 1415) R Sensory Level: L3-Anterior knee, lower leg (05/19/24 1415)  [name]      "

## 2024-05-19 NOTE — Discharge Instructions (Signed)

## 2024-05-19 NOTE — Transfer of Care (Signed)
 Immediate Anesthesia Transfer of Care Note  Patient: Jessica Dominguez  Procedure(s) Performed: ARTHROPLASTY, HIP, TOTAL, ANTERIOR APPROACH (Left: Hip)  Patient Location: PACU  Anesthesia Type:Spinal  Level of Consciousness: awake, alert , oriented, and patient cooperative  Airway & Oxygen Therapy: Patient Spontanous Breathing and Patient connected to face mask oxygen  Post-op Assessment: Report given to RN and Post -op Vital signs reviewed and stable  Post vital signs: Reviewed and stable  Last Vitals:  Vitals Value Taken Time  BP 97/48 05/19/24 12:48  Temp    Pulse 55 05/19/24 12:51  Resp 18 05/19/24 12:51  SpO2 100 % 05/19/24 12:51  Vitals shown include unfiled device data.  Last Pain:  Vitals:   05/19/24 0804  TempSrc: Oral         Complications: No notable events documented.

## 2024-05-19 NOTE — Anesthesia Procedure Notes (Signed)
 Spinal  Patient location during procedure: OR Start time: 05/19/2024 11:05 AM End time: 05/19/2024 11:14 AM Reason for block: surgical anesthesia  Staffing Performed: resident/CRNA  Authorized by: Keneth Lynwood POUR, MD   Performed by: Franchot Delon RAMAN, CRNA  Preanesthetic Checklist Completed: patient identified, IV checked, site marked, risks and benefits discussed, surgical consent, monitors and equipment checked, pre-op evaluation and timeout performed Spinal Block Patient position: sitting Prep: DuraPrep Patient monitoring: heart rate, cardiac monitor, continuous pulse ox and blood pressure Approach: midline Location: L4-5 Injection technique: single-shot Needle Needle type: Sprotte  Needle gauge: 24 G Needle length: 9 cm Assessment Sensory level: T4 Events: CSF return  Additional Notes Pt in sitting position, careful sterile draping. First attempt heme positive. Catheter withdrawn, tip intact. 2nd attempt with clear, free flowing CSF return.

## 2024-05-19 NOTE — Op Note (Signed)
 MEDICAL RECORD NO.: [id number]      FACILITY:  [facility]      PHYSICIAN:  Donnice JONETTA Car  DATE OF BIRTH:  22-May-1945     DATE OF PROCEDURE:  05/19/2024                                 OPERATIVE REPORT         PREOPERATIVE DIAGNOSIS: left hip osteoarthritis.      POSTOPERATIVE DIAGNOSIS:  left hip osteoarthritis.      PROCEDURE:  left total hip replacement through an anterior approach   utilizing DePuy THR system, component size 52 mm pinnacle cup, a size 36+4 neutral   Altrex liner, a size 4 Hi Actis stem with a 36+1.5 delta ceramic   ball.      SURGEON:  Donnice JONETTA. Car, M.D.      ASSISTANT:  Rosina Calin, PA-C     ANESTHESIA:  Spinal.      SPECIMENS:  None.      COMPLICATIONS:  None.      BLOOD LOSS:  400 cc     DRAINS:  None.      INDICATION OF THE PROCEDURE:  Jessica Dominguez is a 78 y.o. female who had   presented to office for evaluation of {left hip pain.  Radiographs revealed   progressive degenerative changes with bone-on-bone   articulation of the  hip joint, including subchondral cystic changes and osteophytes.  The patient had painful limited range of   motion significantly affecting their overall quality of life and function.  The patient was failing to    respond to conservative measures including medications and/or injections and activity modification and at this point was ready   to proceed with more definitive measures.  Consent was obtained for   benefit of pain relief.  Specific risks of infection, DVT, component   failure, dislocation, neurovascular injury, and need for revision surgery were reviewed in the office.     PROCEDURE IN DETAIL:  The patient was brought to operative theater.   Once adequate anesthesia, preoperative antibiotics, 2 gm of Ancef , 1 gm of Tranexamic Acid , and 10 mg of Decadron  were administered, the patient was positioned supine on the [employer] table.  Once the patient was safely positioned with adequate  padding and protection of bony prominences we predraped out the hip, and used fluoroscopy to confirm orientation of the pelvis.      The left hip was then prepped and draped from proximal iliac crest to   mid thigh with a shower curtain technique.      A time-out was performed identifying the patient, planned procedure, and the appropriate extremity.     An incision was then made 2 cm lateral to the   anterior superior iliac spine extending over the orientation of the   tensor fascia lata muscle and sharp dissection was carried down to the   fascia of the muscle.      The fascia was then incised.  The muscle belly was identified and swept   laterally and retractor placed along the superior neck.  Following   cauterization of the circumflex vessels and removing some pericapsular   fat, a second cobra retractor was placed on the inferior neck.  A T-capsulotomy was made along the line of the   superior neck to the trochanteric fossa, then extended proximally and   distally.  Tag sutures were placed and the retractors were then placed   intracapsular.  We then identified the trochanteric fossa and   orientation of my neck cut and then made a neck osteotomy with the femur on traction.  The femoral   head was removed without difficulty or complication.  Traction was let   off and retractors were placed posterior and anterior around the   acetabulum.      The labrum and foveal tissue were debrided.  I began reaming with a 46 mm   reamer and reamed up to 51 mm reamer with good bony bed preparation and a 52 mm  cup was chosen.  The final 52 mm Pinnacle cup was then impacted under fluoroscopy to confirm the depth of penetration and orientation with respect to   Abduction and forward flexion.  A screw was placed into the ilium.  The final   36+4 neutral Altrex liner was impacted with good visualized rim fit.  The cup was positioned anatomically within the acetabular portion of the pelvis.   Peri-acetabular osteophytes were removed.     At this point, the femur was rolled to 100 degrees.  Further capsule was   released off the inferior aspect of the femoral neck.  I then   released the superior capsule proximally.  With the leg in a neutral position the hook was placed laterally   along the femur under the vastus lateralis origin and elevated manually and then held in position using the hook attachment on the bed.  The leg was then extended and adducted with the leg rolled to 100   degrees of external rotation with traction off.  Retractors were placed along the medial calcar and posteriorly over the greater trochanter.  Once the proximal femur was fully   exposed, I used a box osteotome to set orientation.  I then began   broaching with the starting chili pepper broach and passed this by hand and then broached up to 4.  With the 4 broach in place I chose a high offset neck and did several trial reductions.  The offset was appropriate, leg lengths   appeared to be equal best matched with the +1.5 head ball trial confirmed radiographically.   Given these findings, I went ahead and dislocated the hip, repositioned all   retractors and positioned the left hip in the extended and abducted position.  The final 4 Hi Actis stem was   chosen and it was impacted down to the level of neck cut.  Based on this   and the trial reductions, a final 36+1.5 delta ceramic ball was chosen and   impacted onto a clean and dry trunnion, and the hip was reduced.  The   hip had been irrigated throughout the case again at this point.  I did   reapproximate the superior capsular leaflet to the anterior leaflet   using #1 Vicryl.  The fascia of the   tensor fascia lata muscle was then reapproximated using #1 Vicryl and #0 Stratafix sutures.  The   remaining wound was closed with 2-0 Vicryl and running 4-0 Monocryl.   The hip was cleaned, dried, and dressed sterilely using Dermabond and   Aquacel dressing.   The patient was then brought   to recovery room in stable condition tolerating the procedure well.    PA Patti was present for the entirety of the case involved from   preoperative positioning, perioperative retractor management, general   facilitation of the case,  as well as primary wound closure as assistant.            Donnice CORDOBA Ernie, M.D.        05/19/2024 12:31 PM

## 2024-05-19 NOTE — Interval H&P Note (Signed)
 History and Physical Interval Note:  05/19/2024 8:42 AM  Jessica Dominguez  has presented today for surgery, with the diagnosis of Left hip osteoarthritis.  The various methods of treatment have been discussed with the patient and family. After consideration of risks, benefits and other options for treatment, the patient has consented to  Procedures: ARTHROPLASTY, HIP, TOTAL, ANTERIOR APPROACH (Left) as a surgical intervention.  The patient's history has been reviewed, patient examined, no change in status, stable for surgery.  I have reviewed the patient's chart and labs.  Questions were answered to the patient's satisfaction.     Jessica Dominguez

## 2024-05-20 ENCOUNTER — Other Ambulatory Visit (HOSPITAL_COMMUNITY): Payer: Self-pay

## 2024-05-20 DIAGNOSIS — M1612 Unilateral primary osteoarthritis, left hip: Secondary | ICD-10-CM | POA: Diagnosis not present

## 2024-05-20 LAB — CBC
HCT: 30.3 % — ABNORMAL LOW (ref 36.0–46.0)
Hemoglobin: 10.1 g/dL — ABNORMAL LOW (ref 12.0–15.0)
MCH: 29 pg (ref 26.0–34.0)
MCHC: 33.3 g/dL (ref 30.0–36.0)
MCV: 87.1 fL (ref 80.0–100.0)
Platelets: 205 K/uL (ref 150–400)
RBC: 3.48 MIL/uL — ABNORMAL LOW (ref 3.87–5.11)
RDW: 14 % (ref 11.5–15.5)
WBC: 15 K/uL — ABNORMAL HIGH (ref 4.0–10.5)
nRBC: 0 % (ref 0.0–0.2)

## 2024-05-20 LAB — BASIC METABOLIC PANEL WITH GFR
Anion gap: 10 (ref 5–15)
BUN: 24 mg/dL — ABNORMAL HIGH (ref 8–23)
CO2: 22 mmol/L (ref 22–32)
Calcium: 8.4 mg/dL — ABNORMAL LOW (ref 8.9–10.3)
Chloride: 106 mmol/L (ref 98–111)
Creatinine, Ser: 1.17 mg/dL — ABNORMAL HIGH (ref 0.44–1.00)
GFR, Estimated: 48 mL/min — ABNORMAL LOW
Glucose, Bld: 140 mg/dL — ABNORMAL HIGH (ref 70–99)
Potassium: 4.5 mmol/L (ref 3.5–5.1)
Sodium: 137 mmol/L (ref 135–145)

## 2024-05-20 MED ORDER — TRAMADOL HCL 50 MG PO TABS
50.0000 mg | ORAL_TABLET | Freq: Four times a day (QID) | ORAL | 0 refills | Status: AC | PRN
  Filled 2024-05-20: qty 42, 6d supply, fill #0

## 2024-05-20 MED ORDER — METHOCARBAMOL 500 MG PO TABS
500.0000 mg | ORAL_TABLET | Freq: Four times a day (QID) | ORAL | 0 refills | Status: AC | PRN
  Filled 2024-05-20: qty 40, 10d supply, fill #0

## 2024-05-20 MED ORDER — ASPIRIN 81 MG PO CHEW
81.0000 mg | CHEWABLE_TABLET | Freq: Two times a day (BID) | ORAL | 0 refills | Status: AC
  Filled 2024-05-20: qty 56, 28d supply, fill #0

## 2024-05-20 MED ORDER — POLYETHYLENE GLYCOL 3350 17 GM/SCOOP PO POWD
17.0000 g | Freq: Two times a day (BID) | ORAL | 0 refills | Status: AC
  Filled 2024-05-20: qty 238, 7d supply, fill #0

## 2024-05-20 MED ORDER — SENNA 8.6 MG PO TABS
2.0000 | ORAL_TABLET | Freq: Every day | ORAL | 0 refills | Status: AC
  Filled 2024-05-20: qty 120, 60d supply, fill #0

## 2024-05-20 NOTE — TOC Transition Note (Signed)
 Transition of Care [facility]) - Discharge Note   Patient Details  Name: Jessica Dominguez MRN: [id number] Date of Birth: Dec 29, 1945  [facility] CM/SW Contact:  NORMAN ASPEN, LCSW Phone Number: 05/20/2024, 9:21 AM   Clinical Narrative:     Met with pt who confirms she has needed DME in the home.  Plan for HEP.  No further IP CM needs.  Final next level of care: Home/Self Care Barriers to Discharge: No Barriers Identified   Patient Goals and CMS Choice Patient states their goals for this hospitalization and ongoing recovery are:: return home          Discharge Placement                       Discharge Plan and Services Additional resources added to the After Visit Summary for                  DME Arranged: N/A DME Agency: NA                  Social Drivers of Health (SDOH) Interventions SDOH Screenings   Food Insecurity: No Food Insecurity (05/19/2024)  Housing: Low Risk (05/19/2024)  Transportation Needs: No Transportation Needs (05/19/2024)  Utilities: Not At Risk (05/19/2024)  Social Connections: Socially Integrated (05/19/2024)  Tobacco Use: Unknown (05/19/2024)     Readmission Risk Interventions     No data to display

## 2024-05-20 NOTE — Plan of Care (Signed)
  Problem: Activity: Goal: Ability to avoid complications of mobility impairment will improve Outcome: Progressing Goal: Ability to tolerate increased activity will improve Outcome: Progressing   Problem: Clinical Measurements: Goal: Postoperative complications will be avoided or minimized Outcome: Progressing   Problem: Pain Management: Goal: Pain level will decrease with appropriate interventions Outcome: Progressing   

## 2024-05-20 NOTE — Progress Notes (Signed)
 Discharge meds in a secure bag delivered to patient by this RN

## 2024-05-20 NOTE — Progress Notes (Signed)
 Patient ID: Jessica Dominguez, female   DOB: September 17, 1945, 78 y.o.   MRN: [id number] Subjective: 1 Day Post-Op Procedures (LRB): ARTHROPLASTY, HIP, TOTAL, ANTERIOR APPROACH (Left)    Patient reports pain as mild to moderate. Been up in room and with therapy No events  Objective:   VITALS:   Vitals:   05/20/24 0217 05/20/24 0546  BP: (!) 112/54 (!) 112/53  Pulse: 66 65  Resp: 18 18  Temp: 98 F (36.7 C) 97.9 F (36.6 C)  SpO2: 94% 94%    Neurovascular intact Incision: dressing C/D/I  LABS Recent Labs    05/20/24 0317  HGB 10.1*  HCT 30.3*  WBC 15.0*  PLT 205    Recent Labs    05/20/24 0317  NA 137  K 4.5  BUN 24*  CREATININE 1.17*  GLUCOSE 140*    No results for input(s): LABPT, INR in the last 72 hours.   Assessment/Plan: 1 Day Post-Op Procedures (LRB): ARTHROPLASTY, HIP, TOTAL, ANTERIOR APPROACH (Left)   Advance diet Up with therapy Home today after therapy Reviewed activity and goals RTC in 2 weeks Will have her meds sent to her primary pharmacy

## 2024-05-20 NOTE — Progress Notes (Signed)
 Physical Therapy Treatment Patient Details Name: Jessica Dominguez MRN: [id number] DOB: 01/30/1946 Today's Date: 05/20/2024   History of Present Illness Pt is 78 yo female admitted on 05/19/24 for L anterior THA.  Pt with hx including but not limited to arthritis, HCL, HTN    PT Comments  Pt is POD # 1 and is progressing well.  She had good pain control, support at home, DME, and understanding of HEP.  Pt ambulating 150' with RW and performed stairs similar to home set up.  Pt demonstrates safe gait & transfers in order to return home from PT perspective once discharged by MD.  While in hospital, will continue to benefit from PT for skilled therapy to advance mobility and exercises.       If plan is discharge home, recommend the following: A little help with walking and/or transfers;A little help with bathing/dressing/bathroom;Assistance with cooking/housework;Help with stairs or ramp for entrance   Can travel by private vehicle        Equipment Recommendations  None recommended by PT    Recommendations for Other Services       Precautions / Restrictions Precautions Precautions: Fall Restrictions LLE Weight Bearing Per Provider Order: Weight bearing as tolerated     Mobility  Bed Mobility Overal bed mobility: Needs Assistance Bed Mobility: Supine to Sit, Sit to Supine     Supine to sit: HOB elevated, Supervision Sit to supine: Supervision, HOB elevated   General bed mobility comments: Educated on use of gait belt to assist L LE    Transfers Overall transfer level: Needs assistance Equipment used: Rolling walker (2 wheels) Transfers: Sit to/from Stand Sit to Stand: Supervision           General transfer comment: STS x 3 during session    Ambulation/Gait Ambulation/Gait assistance: Supervision Gait Distance (Feet): 150 Feet Assistive device: Rolling walker (2 wheels) Gait Pattern/deviations: Step-through pattern, Decreased stride length       General Gait  Details: Functional speed with step through pattern ; cues to stay close to RW (improved with cues)   Stairs Stairs: Yes Stairs assistance: Contact guard assist Stair Management: One rail Left, Sideways Number of Stairs: 6 General stair comments: Pt typically enters home with 2 steps no rails but also has 6 with bil rails but too far apart to reach both.  Advised 6 steps wtih L rail.  Pt performed this method without difficulty   Wheelchair Mobility     Tilt Bed    Modified Rankin (Stroke Patients Only)       Balance Overall balance assessment: Needs assistance Sitting-balance support: No upper extremity supported Sitting balance-Leahy Scale: Good     Standing balance support: Bilateral upper extremity supported, Reliant on assistive device for balance Standing balance-Leahy Scale: Fair Standing balance comment: Steady with RW; stood for ADLs wtihout UE support                            Communication    Cognition Arousal: Alert Behavior During Therapy: WFL for tasks assessed/performed   PT - Cognitive impairments: No apparent impairments                                Cueing    Exercises Total Joint Exercises Ankle Circles/Pumps: AROM, Both, 10 reps, Supine Quad Sets: AROM, Both, 10 reps, Supine Heel Slides: AAROM, Left, 10 reps, Supine Hip ABduction/ADduction:  AAROM, Left, 10 reps, Supine Long Arc Quad: AROM, Left, 10 reps, Seated Other Exercises Other Exercises: Standing AROM with RW and CGA on L: hip ext and hip abd x 10; h-s curl and hip flex/marching x 5 due to pain    General Comments  Educated on safe ice use, no pivots, car transfers, and TED hose during day. Also, encouraged walking every 1-2 hours during day for 5-10 mins. Educated on HEP with focus on mobility the first week. Discussed doing exercises within pain control and if pain increasing could decreased ROM, reps, and stop exercises as needed. Encouraged to perform ankle  pumps frequently for blood flow.       Pertinent Vitals/Pain Pain Assessment Pain Assessment: 0-10 Pain Score: 2  Pain Location: L hip Pain Descriptors / Indicators: Discomfort Pain Intervention(s): Limited activity within patient's tolerance, Monitored during session, Premedicated before session, Repositioned    Home Living                          Prior Function            PT Goals (current goals can now be found in the care plan section) Progress towards PT goals: Progressing toward goals    Frequency    7X/week      PT Plan      Co-evaluation              AM-PAC PT 6 Clicks Mobility   Outcome Measure  Help needed turning from your back to your side while in a flat bed without using bedrails?: A Little Help needed moving from lying on your back to sitting on the side of a flat bed without using bedrails?: A Little Help needed moving to and from a bed to a chair (including a wheelchair)?: A Little Help needed standing up from a chair using your arms (e.g., wheelchair or bedside chair)?: A Little Help needed to walk in hospital room?: A Little Help needed climbing 3-5 steps with a railing? : A Little 6 Click Score: 18    End of Session Equipment Utilized During Treatment: Gait belt Activity Tolerance: Patient tolerated treatment well Patient left: with call bell/phone within reach;in chair;with chair alarm set;with family/visitor present Nurse Communication: Mobility status PT Visit Diagnosis: Other abnormalities of gait and mobility (R26.89);Muscle weakness (generalized) (M62.81)     Time: 8942-8874 PT Time Calculation (min) (ACUTE ONLY): 28 min  Charges:    $Gait Training: 8-22 mins $Therapeutic Exercise: 8-22 mins PT General Charges $$ ACUTE PT VISIT: 1 Visit                     Benjiman, PT Acute Rehab Services [facility] [phone]    Benjiman VEAR Mulberry 05/20/2024, 1:16 PM

## 2024-05-22 ENCOUNTER — Encounter (HOSPITAL_COMMUNITY): Payer: Self-pay | Admitting: Orthopedic Surgery

## 2024-06-04 NOTE — Discharge Summary (Signed)
 Patient ID: MAERYN MCGATH MRN: [id number] DOB/AGE: 23-Mar-1946 79 y.o.  Admit date: 05/19/2024 Discharge date: 05/20/2024  Admission Diagnoses:  Left hip osteoarthritis  Discharge Diagnoses:  Principal Problem:   S/P total left hip arthroplasty   Past Medical History:  Diagnosis Date   Arthritis    Hypercholesteremia    Hypertension    Seasonal allergies     Surgeries: Procedures: ARTHROPLASTY, HIP, TOTAL, ANTERIOR APPROACH on 05/19/2024   Consultants:   Discharged Condition: Improved  Hospital Course: HILARIA TITSWORTH is an 79 y.o. female who was admitted 05/19/2024 for operative treatment ofS/P total left hip arthroplasty. Patient has severe unremitting pain that affects sleep, daily activities, and work/hobbies. After pre-op clearance the patient was taken to the operating room on 05/19/2024 and underwent  Procedures: ARTHROPLASTY, HIP, TOTAL, ANTERIOR APPROACH.    Patient was given perioperative antibiotics:  Anti-infectives (From admission, onward)    Start     Dose/Rate Route Frequency Ordered Stop   05/19/24 1730  ceFAZolin  (ANCEF ) IVPB 2g/100 mL premix        2 g 200 mL/hr over 30 Minutes Intravenous Every 6 hours 05/19/24 1532 05/20/24 0115   05/19/24 0815  ceFAZolin  (ANCEF ) IVPB 2g/100 mL premix        2 g 200 mL/hr over 30 Minutes Intravenous On call to O.R. 05/19/24 9187 05/19/24 1115        Patient was given sequential compression devices, early ambulation, and chemoprophylaxis to prevent DVT. Patient worked with PT and was meeting their goals regarding safe ambulation and transfers.  Patient benefited maximally from hospital stay and there were no complications.    Recent vital signs: No data found.   Recent laboratory studies: No results for input(s): WBC, HGB, HCT, PLT, NA, K, CL, CO2, BUN, CREATININE, GLUCOSE, INR, CALCIUM  in the last 72 hours.  Invalid input(s): PT, 2   Discharge Medications:   Allergies as  of 05/20/2024   No Known Allergies      Medication List     STOP taking these medications    diphenhydramine -acetaminophen  25-500 MG Tabs tablet Commonly known as: TYLENOL  PM       TAKE these medications    acetaminophen  500 MG tablet Commonly known as: TYLENOL  Take 1,000 mg by mouth every 6 (six) hours as needed for moderate pain.   Aspirin  Low Dose 81 MG chewable tablet Generic drug: aspirin  Chew 1 tablet (81 mg total) by mouth 2 (two) times daily for 28 days.   celecoxib 200 MG capsule Commonly known as: CELEBREX Take 200 mg by mouth at bedtime.   dicyclomine  10 MG capsule Commonly known as: BENTYL  Take 1 capsule (10 mg total) by mouth 3 (three) times daily as needed for spasms. What changed:  when to take this additional instructions   loperamide 2 MG tablet Commonly known as: IMODIUM A-D Take 2 mg by mouth 4 (four) times daily as needed for diarrhea or loose stools.   Magnesium 250 MG Tabs Take 1 tablet by mouth every evening.   methocarbamol  500 MG tablet Commonly known as: ROBAXIN  Take 1 tablet (500 mg total) by mouth every 6 (six) hours as needed for muscle spasms (thigh pain).   OVER THE COUNTER MEDICATION Take 1 capsule by mouth in the morning and at bedtime. Bio Complete 3   polyethylene glycol powder 17 GM/SCOOP powder Commonly known as: GLYCOLAX /MIRALAX  Take 17 g by mouth 2 (two) times daily. Dissolve 1 capful (17g) in 4-8 ounces of liquid and take by twice  mouth daily.   ROLAIDS PO Take 1 tablet by mouth daily as needed (heartburn).   rosuvastatin  20 MG tablet Commonly known as: CRESTOR  Take 20 mg by mouth at bedtime.   senna 8.6 MG Tabs tablet Commonly known as: SENOKOT Take 2 tablets (17.2 mg total) by mouth at bedtime.   traMADol  50 MG tablet Commonly known as: ULTRAM  Take 1-2 tablets (50-100 mg total) by mouth every 6 (six) hours as needed for severe pain (pain score 7-10).               Discharge Care Instructions   (From admission, onward)           Start     Ordered   05/20/24 0000  Change dressing       Comments: Maintain surgical dressing until follow up in the clinic. If the edges start to pull up, may reinforce with tape. If the dressing is no longer working, may remove and cover with gauze and tape, but must keep the area dry and clean.  Call with any questions or concerns.   05/20/24 1207            Diagnostic Studies: DG Pelvis Portable Result Date: 05/19/2024 CLINICAL DATA:  Status post hip arthroplasty. EXAM: PORTABLE PELVIS 1-2 VIEWS COMPARISON:  None Available. FINDINGS: Left hip arthroplasty in expected alignment. No periprosthetic lucency or fracture. Recent postsurgical change includes air and edema in the soft tissues. IMPRESSION: Left hip arthroplasty without immediate postoperative complication. Electronically Signed   By: Andrea Gasman M.D.   On: 05/19/2024 14:14   DG HIP UNILAT WITH PELVIS 1V LEFT Result Date: 05/19/2024 CLINICAL DATA:  Elective surgery. EXAM: DG HIP (WITH OR WITHOUT PELVIS) 1V*L* COMPARISON:  None Available. FINDINGS: Nine fluoroscopic spot views of the pelvis and left hip obtained in the operating room. Sequential images during hip arthroplasty. Fluoroscopy time 10 seconds. Dose 0.97 mGy. IMPRESSION: Intraoperative fluoroscopy during left hip arthroplasty. Electronically Signed   By: Andrea Gasman M.D.   On: 05/19/2024 14:14   DG C-Arm 1-60 Min-No Report Result Date: 05/19/2024 Fluoroscopy was utilized by the requesting physician.  No radiographic interpretation.   DG C-Arm 1-60 Min-No Report Result Date: 05/19/2024 Fluoroscopy was utilized by the requesting physician.  No radiographic interpretation.    Disposition: Discharge disposition: 01-Home or Self Care       Discharge Instructions     Call MD / Call 911   Complete by: As directed    If you experience chest pain or shortness of breath, CALL 911 and be transported to the hospital  emergency room.  If you develope a fever above 101 F, pus (white drainage) or increased drainage or redness at the wound, or calf pain, call your surgeon's office.   Change dressing   Complete by: As directed    Maintain surgical dressing until follow up in the clinic. If the edges start to pull up, may reinforce with tape. If the dressing is no longer working, may remove and cover with gauze and tape, but must keep the area dry and clean.  Call with any questions or concerns.   Constipation Prevention   Complete by: As directed    Drink plenty of fluids.  Prune juice may be helpful.  You may use a stool softener, such as Colace (over the counter) 100 mg twice a day.  Use MiraLax  (over the counter) for constipation as needed.   Diet - low sodium heart healthy   Complete by: As directed  Increase activity slowly as tolerated   Complete by: As directed    Weight bearing as tolerated with assist device (walker, cane, etc) as directed, use it as long as suggested by your surgeon or therapist, typically at least 4-6 weeks.   Post-operative opioid taper instructions:   Complete by: As directed    POST-OPERATIVE OPIOID TAPER INSTRUCTIONS: It is important to wean off of your opioid medication as soon as possible. If you do not need pain medication after your surgery it is ok to stop day one. Opioids include: Codeine, Hydrocodone(Norco, Vicodin), Oxycodone (Percocet, oxycontin ) and hydromorphone  amongst others.  Long term and even short term use of opiods can cause: Increased pain response Dependence Constipation Depression Respiratory depression And more.  Withdrawal symptoms can include Flu like symptoms Nausea, vomiting And more Techniques to manage these symptoms Hydrate well Eat regular healthy meals Stay active Use relaxation techniques(deep breathing, meditating, yoga) Do Not substitute Alcohol to help with tapering If you have been on opioids for less than two weeks and do not have  pain than it is ok to stop all together.  Plan to wean off of opioids This plan should start within one week post op of your joint replacement. Maintain the same interval or time between taking each dose and first decrease the dose.  Cut the total daily intake of opioids by one tablet each day Next start to increase the time between doses. The last dose that should be eliminated is the evening dose.      TED hose   Complete by: As directed    Use stockings (TED hose) for 2 weeks on both leg(s).  You may remove them at night for sleeping.        Follow-up Information     Ernie Cough, MD. Schedule an appointment as soon as possible for a visit in 2 week(s).   Specialty: Orthopedic Surgery Contact information: [address] [place] [address] [phone]                  Signed: Rosina JONELLE Calin 06/04/2024, 6:58 AM
